# Patient Record
Sex: Female | Born: 2000 | Race: White | Hispanic: No | Marital: Single | Smoking: Never smoker
Health system: Southern US, Community
[De-identification: ages and names within clinical notes are randomized; demographics above are authoritative.]

## PROBLEM LIST (undated history)

## (undated) DIAGNOSIS — F419 Anxiety disorder, unspecified: Secondary | ICD-10-CM

## (undated) HISTORY — DX: Anxiety disorder, unspecified: F41.9

## (undated) HISTORY — PX: NO PAST SURGERIES: SHX2092

---
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
## 2020-02-21 ENCOUNTER — Ambulatory Visit: Payer: Medicaid Other | Attending: Nurse Practitioner | Admitting: Nurse Practitioner

## 2020-02-21 ENCOUNTER — Encounter: Payer: Self-pay | Admitting: Nurse Practitioner

## 2020-02-21 ENCOUNTER — Other Ambulatory Visit: Payer: Self-pay

## 2020-02-21 VITALS — Ht 65.0 in | Wt 168.0 lb

## 2020-02-21 DIAGNOSIS — Z7689 Persons encountering health services in other specified circumstances: Secondary | ICD-10-CM | POA: Diagnosis not present

## 2020-02-21 DIAGNOSIS — Z30011 Encounter for initial prescription of contraceptive pills: Secondary | ICD-10-CM | POA: Diagnosis not present

## 2020-02-21 MED ORDER — ORTHO-NOVUM 7/7/7 (28) 0.5/0.75/1-35 MG-MCG PO TABS: 1.0000 | ORAL_TABLET | Freq: Every day | ORAL | 6 refills | Status: DC

## 2020-02-21 NOTE — Progress Notes (Signed)
Virtual Visit via Telephone Note Due to national recommendations of social distancing due to COVID 19, telehealth visit is felt to be most appropriate for this patient at this time.  I discussed the limitations, risks, security and privacy concerns of performing an evaluation and management service by telephone and the availability of in person appointments. I also discussed with the patient that there may be a patient responsible charge related to this service. The patient expressed understanding and agreed to proceed.    I connected with Becky Day on 02/21/20  at   1:50 PM EST  EDT by telephone and verified that I am speaking with the correct person using two identifiers.   Consent I discussed the limitations, risks, security and privacy concerns of performing an evaluation and management service by telephone and the availability of in person appointments. I also discussed with the patient that there may be a patient responsible charge related to this service. The patient expressed understanding and agreed to proceed.   Location of Patient: Private Residence   [facility] of Provider: [facility]    Persons participating in Telemedicine visit: Becky Denver FNP-BC [name] CMA Becky Day    History of Present Illness: Telemedicine visit for: Establish Care  She has a past medical history of Anxiety.  She is sexually active and is requesting to start OCPs. Her last menstrual cycles was 02-10-2020. She has been sexually active. She will need to come in for a pregnancy test. She is aware that she should not start her OCPs prior to her pregnancy test results. She has taken OCPs in the past as well and denies any side effects.   She denies any previous history of HTN, DM, thyroid disorder or anemia.      Past Medical History:  Diagnosis Date  . Anxiety     Past Surgical History:  Procedure Laterality Date  . NO PAST SURGERIES      Family History   Problem Relation Age of Onset  . Bipolar disorder Mother   . Bipolar disorder Sister   . Bipolar disorder Sister   . Bipolar disorder Sister     Social History   Socioeconomic History  . Marital status: Single    Spouse name: Not on file  . Number of children: Not on file  . Years of education: Not on file  . Highest education level: Not on file  Occupational History  . Not on file  Tobacco Use  . Smoking status: Never Smoker  . Smokeless tobacco: Never Used  Substance and Sexual Activity  . Alcohol use: Not Currently  . Drug use: Not Currently  . Sexual activity: Yes  Other Topics Concern  . Not on file  Social History Narrative  . Not on file   Social Determinants of Health   Financial Resource Strain: Not on file  Food Insecurity: Not on file  Transportation Needs: Not on file  Physical Activity: Not on file  Stress: Not on file  Social Connections: Not on file     Observations/Objective: Awake, alert and oriented x 3   ROS  Assessment and Plan: Becky Day was seen today for new patient (initial visit).  Diagnoses and all orders for this visit:  Encounter to establish care  Oral contraception initial prescription -     norethindrone-ethinyl estradiol (ORTHO-NOVUM 7/7/7, 28,) 0.5/0.75/1-35 MG-MCG tablet; Take 1 tablet by mouth daily. -     POCT urine pregnancy; Future    Follow Up  Instructions Return in about 8 weeks (around 04/17/2020).     I discussed the assessment and treatment plan with the patient. The patient was provided an opportunity to ask questions and all were answered. The patient agreed with the plan and demonstrated an understanding of the instructions.   The patient was advised to call back or seek an in-person evaluation if the symptoms worsen or if the condition fails to improve as anticipated.  I provided 10 minutes of non-face-to-face time during this encounter including median intraservice time, reviewing previous notes, labs, imaging,  medications and explaining diagnosis and management.  Claiborne Rigg, FNP-BC

## 2020-02-26 ENCOUNTER — Other Ambulatory Visit: Payer: Self-pay

## 2020-02-26 ENCOUNTER — Other Ambulatory Visit: Payer: Self-pay | Admitting: Nurse Practitioner

## 2020-02-26 ENCOUNTER — Ambulatory Visit: Payer: Medicaid Other | Attending: Nurse Practitioner

## 2020-02-26 DIAGNOSIS — Z130 Encounter for screening for diseases of the blood and blood-forming organs and certain disorders involving the immune mechanism: Secondary | ICD-10-CM

## 2020-02-27 LAB — CBC
Hematocrit: 41.2 % (ref 34.0–46.6)
Hemoglobin: 13.4 g/dL (ref 11.1–15.9)
MCH: 28.3 pg (ref 26.6–33.0)
MCHC: 32.5 g/dL (ref 31.5–35.7)
MCV: 87 fL (ref 79–97)
Platelets: 320 10*3/uL (ref 150–450)
RBC: 4.73 x10E6/uL (ref 3.77–5.28)
RDW: 12.1 % (ref 11.7–15.4)
WBC: 6.4 10*3/uL (ref 3.4–10.8)

## 2020-04-03 ENCOUNTER — Encounter: Payer: Medicaid Other | Admitting: Nurse Practitioner

## 2020-07-28 ENCOUNTER — Other Ambulatory Visit: Payer: Self-pay | Admitting: Nurse Practitioner

## 2020-07-28 DIAGNOSIS — Z30011 Encounter for initial prescription of contraceptive pills: Secondary | ICD-10-CM

## 2020-07-28 NOTE — Telephone Encounter (Signed)
   Notes to clinic:  Patient was a no show to appt on 04/03/2020 Review for refill    Requested Prescriptions  Pending Prescriptions Disp Refills   norethindrone-ethinyl estradiol (ORTHO-NOVUM 7/7/7, 28,) 0.5/0.75/1-35 MG-MCG tablet 28 tablet 6    Sig: Take 1 tablet by mouth daily.      OB/GYN:  Contraceptives Failed - 07/28/2020 11:13 AM      Failed - Last BP in normal range    BP Readings from Last 1 Encounters:  No data found for BP          Passed - Valid encounter within last 12 months    Recent Outpatient Visits           5 months ago Encounter to establish care   [facility] [place], Shea Stakes, NP

## 2020-07-28 NOTE — Telephone Encounter (Signed)
Copied from CRM [id number]. Topic: Quick Communication - Rx Refill/Question >> Jul 28, 2020 11:07 AM Marylen Ponto wrote: Medication: norethindrone-ethinyl estradiol (ORTHO-NOVUM 7/7/7, 28,) 0.5/0.75/1-35 MG-MCG tablet  Has the patient contacted their pharmacy? No. Pt mother stated pt was told to contact provider (Agent: If no, request that the patient contact the pharmacy for the refill.) (Agent: If yes, when and what did the pharmacy advise?)  Preferred Pharmacy (with phone number or street name): [id number] [place] Sp[place], [place] - [address] AT [facility] [address]  Phone: [phone]  Fax: [phone]  Agent: Please be advised that RX refills may take up to 3 business days. We ask that you follow-up with your pharmacy.

## 2020-08-24 ENCOUNTER — Other Ambulatory Visit: Payer: Self-pay | Admitting: Nurse Practitioner

## 2020-08-24 DIAGNOSIS — Z30011 Encounter for initial prescription of contraceptive pills: Secondary | ICD-10-CM

## 2020-08-24 NOTE — Telephone Encounter (Signed)
Requested medications are due for refill today no, filled 08/24/20  Requested medications are on the active medication list yes  Last refill 08/24/20  Last visit 02/2020, asked to return in April, made appt and No Show  Future visit scheduled no  Notes to clinic Did not show for follow up appt, just filled today, no upcoming appt scheduled.

## 2021-01-15 ENCOUNTER — Encounter: Payer: Self-pay | Admitting: Nurse Practitioner

## 2021-01-15 ENCOUNTER — Ambulatory Visit: Payer: Medicaid Other | Attending: Nurse Practitioner | Admitting: Nurse Practitioner

## 2021-01-15 ENCOUNTER — Other Ambulatory Visit: Payer: Self-pay

## 2021-01-15 DIAGNOSIS — Z30011 Encounter for initial prescription of contraceptive pills: Secondary | ICD-10-CM

## 2021-01-15 DIAGNOSIS — Z7251 High risk heterosexual behavior: Secondary | ICD-10-CM

## 2021-01-15 MED ORDER — NORTREL 7/7/7 0.5/0.75/1-35 MG-MCG PO TABS: 1.0000 | ORAL_TABLET | Freq: Every day | ORAL | 11 refills | Status: DC

## 2021-01-15 NOTE — Progress Notes (Signed)
Virtual Visit via Telephone Note Due to national recommendations of social distancing due to COVID 19, telehealth visit is felt to be most appropriate for this patient at this time.  I discussed the limitations, risks, security and privacy concerns of performing an evaluation and management service by telephone and the availability of in person appointments. I also discussed with the patient that there may be a patient responsible charge related to this service. The patient expressed understanding and agreed to proceed.    I connected with Becky Day on 01/15/21  at  10:10 AM EST  EDT by telephone and verified that I am speaking with the correct person using two identifiers.  Location of Patient: Private Residence   [facility] of Provider: [facility]    Persons participating in Telemedicine visit: Becky Day    History of Present Illness: Telemedicine visit for: Medication refill and STD testing  She is requesting a refill of her oral contraceptives and would like STD testing. She currently denies any GU or GYN symptoms. Wants to know if she can skip placebo pills of her birth control to not have her period. I instructed her if she wants to not have her periods we would need to put her on a birth control that suppress her menstrual cycle and her current prescription would not allow this long term.  She is taking her OCPs as prescribed.      Past Medical History:  Diagnosis Date   Anxiety     Past Surgical History:  Procedure Laterality Date   NO PAST SURGERIES      Family History  Problem Relation Age of Onset   Bipolar disorder Mother    Bipolar disorder Sister    Bipolar disorder Sister    Bipolar disorder Sister     Social History   Socioeconomic History   Marital status: Single    Spouse name: Not on file   Number of children: Not on file   Years of education: Not on file   Highest education level: Not on file   Occupational History   Not on file  Tobacco Use   Smoking status: Never   Smokeless tobacco: Never  Substance and Sexual Activity   Alcohol use: Not Currently   Drug use: Not Currently   Sexual activity: Yes  Other Topics Concern   Not on file  Social History Narrative   Not on file   Social Determinants of Health   Financial Resource Strain: Not on file  Food Insecurity: Not on file  Transportation Needs: Not on file  Physical Activity: Not on file  Stress: Not on file  Social Connections: Not on file     Observations/Objective: Awake, alert and oriented x 3   Review of Systems  Constitutional:  Negative for fever, malaise/fatigue and weight loss.  HENT: Negative.  Negative for nosebleeds.   Eyes: Negative.  Negative for blurred vision, double vision and photophobia.  Respiratory: Negative.  Negative for cough and shortness of breath.   Cardiovascular: Negative.  Negative for chest pain, palpitations and leg swelling.  Gastrointestinal: Negative.  Negative for heartburn, nausea and vomiting.  Musculoskeletal: Negative.  Negative for myalgias.  Neurological: Negative.  Negative for dizziness, focal weakness, seizures and headaches.  Psychiatric/Behavioral: Negative.  Negative for suicidal ideas.    Assessment and Plan: Diagnoses and all orders for this visit:  Oral contraception initial prescription -     norethindrone-ethinyl estradiol (NORTREL 7/7/7) 0.5/0.75/1-35 MG-MCG  tablet; Take 1 tablet by mouth daily.  High risk heterosexual behavior -     Cervicovaginal ancillary only; Future -     HIV antibody (with reflex); Future -     HCV Ab w Reflex to Quant PCR; Future     Follow Up Instructions Return if symptoms worsen or fail to improve.     I discussed the assessment and treatment plan with the patient. The patient was provided an opportunity to ask questions and all were answered. The patient agreed with the plan and demonstrated an understanding of the  instructions.   The patient was advised to call back or seek an in-person evaluation if the symptoms worsen or if the condition fails to improve as anticipated.  I provided 11 minutes of non-face-to-face time during this encounter including median intraservice time, reviewing previous notes, labs, imaging, medications and explaining diagnosis and management.  Claiborne Rigg, FNP-BC

## 2021-02-18 ENCOUNTER — Ambulatory Visit: Payer: Self-pay

## 2021-02-18 ENCOUNTER — Other Ambulatory Visit: Payer: Self-pay | Admitting: Nurse Practitioner

## 2021-02-18 DIAGNOSIS — Z30011 Encounter for initial prescription of contraceptive pills: Secondary | ICD-10-CM

## 2021-02-18 NOTE — Telephone Encounter (Signed)
Pt called to get STD test appt / she think she has a yeast infection but wants to be sure / please advise  Voice mailbox not set up, unable to leave a message.

## 2021-02-18 NOTE — Telephone Encounter (Signed)
Pt called, pt states she wanted to schedule an appt for STD testing. Advised her appts were out until 03/17/21 and if she needed testing sooner she could call [facility] and see if they had any appts. Pt was ok scheduling for 03/17/21. Pt didn't report any yeast infections symptoms or any other concerns.

## 2021-02-18 NOTE — Telephone Encounter (Signed)
Refilled 01/15/2021 #28 with 11 refills.   Requested Prescriptions  Pending Prescriptions Disp Refills   NORTREL 7/7/7 0.5/0.75/1-35 MG-MCG tablet [Pharmacy Med Name: NORTREL 7/ 7/ 7 TABLETS 28S] 28 tablet 11    Sig: TAKE 1 TABLET BY MOUTH DAILY     OB/GYN:  Contraceptives Failed - 02/18/2021  3:17 AM      Failed - Last BP in normal range    BP Readings from Last 1 Encounters:  No data found for BP         Failed - Valid encounter within last 12 months    Recent Outpatient Visits          1 month ago Oral contraception initial prescription   [facility] [place], Shea Stakes, NP   12 months ago Encounter to establish care   [facility] [place], Shea Stakes, NP             Passed - Patient is not a smoker

## 2021-03-17 ENCOUNTER — Ambulatory Visit: Payer: Medicaid Other | Attending: Physician Assistant

## 2021-03-17 ENCOUNTER — Other Ambulatory Visit: Payer: Self-pay

## 2021-03-17 ENCOUNTER — Encounter: Payer: Self-pay | Admitting: Nurse Practitioner

## 2021-03-17 ENCOUNTER — Other Ambulatory Visit (HOSPITAL_COMMUNITY)
Admission: RE | Admit: 2021-03-17 | Discharge: 2021-03-17 | Disposition: A | Discharge status: Home / Self Care | Payer: Medicaid Other | Source: Ambulatory Visit | Attending: Physician Assistant | Admitting: Physician Assistant

## 2021-03-17 DIAGNOSIS — Z7251 High risk heterosexual behavior: Secondary | ICD-10-CM

## 2021-03-18 LAB — CERVICOVAGINAL ANCILLARY ONLY
Bacterial Vaginitis (gardnerella): NEGATIVE
Candida Glabrata: NEGATIVE
Candida Vaginitis: NEGATIVE
Chlamydia: NEGATIVE
Comment: NEGATIVE
Comment: NEGATIVE
Comment: NEGATIVE
Comment: NEGATIVE
Comment: NEGATIVE
Comment: NORMAL
Neisseria Gonorrhea: NEGATIVE
Trichomonas: NEGATIVE

## 2021-03-18 LAB — HCV AB W REFLEX TO QUANT PCR: HCV Ab: NONREACTIVE

## 2021-03-18 LAB — HIV ANTIBODY (ROUTINE TESTING W REFLEX): HIV Screen 4th Generation wRfx: NONREACTIVE

## 2021-03-18 LAB — HCV INTERPRETATION

## 2021-03-23 ENCOUNTER — Other Ambulatory Visit: Payer: Self-pay

## 2021-03-23 ENCOUNTER — Ambulatory Visit: Payer: Medicaid Other | Attending: Nurse Practitioner | Admitting: Nurse Practitioner

## 2021-03-23 ENCOUNTER — Encounter: Payer: Self-pay | Admitting: Nurse Practitioner

## 2021-03-23 DIAGNOSIS — F419 Anxiety disorder, unspecified: Secondary | ICD-10-CM

## 2021-03-23 DIAGNOSIS — F32A Depression, unspecified: Secondary | ICD-10-CM

## 2021-03-23 MED ORDER — ESCITALOPRAM OXALATE 10 MG PO TABS: 10.0000 mg | ORAL_TABLET | Freq: Every day | ORAL | 1 refills | Status: DC

## 2021-03-23 NOTE — Progress Notes (Signed)
Virtual Visit via Telephone Note ?Due to national recommendations of social distancing due to COVID 19, telehealth visit is felt to be most appropriate for this patient at this time.  I discussed the limitations, risks, security and privacy concerns of performing an evaluation and management service by telephone and the availability of in person appointments. I also discussed with the patient that there may be a patient responsible charge related to this service. The patient expressed understanding and agreed to proceed.  ? ? ?I connected with Catha Gosselin on 03/23/21  at   3:10 PM EDT  EDT by telephone and verified that I am speaking with the correct person using two identifiers. ? ?Location of Patient: ?Private Residence ?  ?Location of Provider: ?[facility] [employer] [facility]  ?  ?Persons participating in Telemedicine visit: ?[name] FNP-BC ?Catha Gosselin  ?  ?History of Present Illness: ?Telemedicine visit for: Anxiety and Depression ?States she has been depresses and anxious "my whole life".  ? ?She endorses associated symptoms of panic attacks, anxiety attacks with difficulty breathing. She also has social anxiety. Will start lexapro 10 mg daily. She does endorse ruminating thoughts of self harm with no specific plan. Feels like she sometimes does not "want to be here".  ?Depression screen [facility] 2/9 03/23/2021 02/21/2020  ?Decreased Interest 1 2  ?Down, Depressed, Hopeless 1 0  ?PHQ - 2 Score 2 2  ?Altered sleeping 3 1  ?Tired, decreased energy 3 2  ?Change in appetite 2 1  ?Feeling bad or failure about yourself  1 3  ?Trouble concentrating 0 0  ?Moving slowly or fidgety/restless 0 0  ?Suicidal thoughts 1 0  ?PHQ-9 Score 12 9  ?Difficult doing work/chores Very difficult -  ?  ?GAD 7 : Generalized Anxiety Score 03/23/2021 02/21/2020  ?Nervous, Anxious, on Edge 2 1  ?Control/stop worrying 2 0  ?Worry too much - different things 2 0  ?Trouble relaxing 1 0  ?Restless 0 0  ?Easily annoyed or irritable 1  0  ?Afraid - awful might happen 2 0  ?Total GAD 7 Score 10 1  ?Anxiety Difficulty Somewhat difficult -  ? ?  ? ? ? ?Past Medical History:  ?Diagnosis Date  ? Anxiety   ?  ?Past Surgical History:  ?Procedure Laterality Date  ? NO PAST SURGERIES    ?  ?Family History  ?Problem Relation Age of Onset  ? Bipolar disorder Mother   ? Bipolar disorder Sister   ? Bipolar disorder Sister   ? Bipolar disorder Sister   ?  ?Social History  ? ?Socioeconomic History  ? Marital status: Single  ?  Spouse name: Not on file  ? Number of children: Not on file  ? Years of education: Not on file  ? Highest education level: Not on file  ?Occupational History  ? Not on file  ?Tobacco Use  ? Smoking status: Never  ? Smokeless tobacco: Never  ?Substance and Sexual Activity  ? Alcohol use: Not Currently  ? Drug use: Not Currently  ? Sexual activity: Yes  ?Other Topics Concern  ? Not on file  ?Social History Narrative  ? Not on file  ? ?Social Determinants of Health  ? ?Financial Resource Strain: Not on file  ?Food Insecurity: Not on file  ?Transportation Needs: Not on file  ?Physical Activity: Not on file  ?Stress: Not on file  ?Social Connections: Not on file  ?  ? ?Observations/Objective: ?Awake, alert and oriented x 3 ? ? ?  Review of Systems  ?Constitutional:  Negative for fever, malaise/fatigue and weight loss.  ?HENT: Negative.  Negative for nosebleeds.   ?Eyes: Negative.  Negative for blurred vision, double vision and photophobia.  ?Respiratory: Negative.  Negative for cough and shortness of breath.   ?Cardiovascular: Negative.  Negative for chest pain, palpitations and leg swelling.  ?Gastrointestinal: Negative.  Negative for heartburn, nausea and vomiting.  ?Musculoskeletal: Negative.  Negative for myalgias.  ?Neurological: Negative.  Negative for dizziness, focal weakness, seizures and headaches.  ?Psychiatric/Behavioral:  Positive for depression. Negative for suicidal ideas. The patient is nervous/anxious.    ?Assessment and  Plan: ?Diagnoses and all orders for this visit: ? ?Anxiety and depression ?-     escitalopram (LEXAPRO) 10 MG tablet; Take 1 tablet (10 mg total) by mouth daily. Take 1/2 tablet 5mg  for 2 weeks. Increase to 1 whole tablet 10mg  after 2 weeks if needed. ?-     Ambulatory referral to Integrated Behavioral Health ? ?  ? ?Follow Up Instructions ?Return in about 4 weeks (around 04/20/2021).  ? ?  ?I discussed the assessment and treatment plan with the patient. The patient was provided an opportunity to ask questions and all were answered. The patient agreed with the plan and demonstrated an understanding of the instructions. ?  ?The patient was advised to call back or seek an in-person evaluation if the symptoms worsen or if the condition fails to improve as anticipated. ? ?I provided 15 minutes of non-face-to-face time during this encounter including median intraservice time, reviewing previous notes, labs, imaging, medications and explaining diagnosis and management. ? ? , FNP-BC  ?

## 2021-04-07 ENCOUNTER — Ambulatory Visit: Payer: Self-pay | Admitting: *Deleted

## 2021-04-07 ENCOUNTER — Telehealth: Payer: Medicaid Other | Admitting: Physician Assistant

## 2021-04-07 DIAGNOSIS — J029 Acute pharyngitis, unspecified: Secondary | ICD-10-CM

## 2021-04-07 MED ORDER — AMOXICILLIN 500 MG PO CAPS: 500.0000 mg | ORAL_CAPSULE | Freq: Two times a day (BID) | ORAL | 0 refills | Status: AC

## 2021-04-07 NOTE — Patient Instructions (Signed)
?Catha Gosselin, thank you for joining Piedad Climes, PA-C for today's virtual visit.  While this provider is not your primary care provider (PCP), if your PCP is located in our provider database this encounter information will be shared with them immediately following your visit. ? ?Consent: ?(Patient) Becky Day provided verbal consent for this virtual visit at the beginning of the encounter. ? ?Current Medications: ? ?Current Outpatient Medications:  ?  amoxicillin (AMOXIL) 500 MG capsule, Take 1 capsule (500 mg total) by mouth 2 (two) times daily for 10 days., Disp: 20 capsule, Rfl: 0 ?  escitalopram (LEXAPRO) 10 MG tablet, Take 1 tablet (10 mg total) by mouth daily. Take 1/2 tablet 5mg  for 2 weeks. Increase to 1 whole tablet 10mg  after 2 weeks if needed., Disp: 30 tablet, Rfl: 1 ?  norethindrone-ethinyl estradiol (NORTREL 7/7/7) 0.5/0.75/1-35 MG-MCG tablet, Take 1 tablet by mouth daily., Disp: 28 tablet, Rfl: 11  ? ?Medications ordered in this encounter:  ?Meds ordered this encounter  ?Medications  ? amoxicillin (AMOXIL) 500 MG capsule  ?  Sig: Take 1 capsule (500 mg total) by mouth 2 (two) times daily for 10 days.  ?  Dispense:  20 capsule  ?  Refill:  0  ?  Order Specific Question:   Supervising Provider  ?  Answer:   11-22-2003 [[id number]]  ?  ? ?*If you need refills on other medications prior to your next appointment, please contact your pharmacy* ? ?Follow-Up: ?Call back or seek an in-person evaluation if the symptoms worsen or if the condition fails to improve as anticipated. ? ?Other Instructions ?Please keep well-hydrated and get plenty of rest. ?Start salt-water gargles. ?You can alternate Tylenol and Ibuprofen as needed for pain. ?Take the antibiotic as directed with food to help prevent any stomach upset. ? ?If symptoms are not resolving, or you note any new/worsening symptoms despite treatment, please be evaluated in person.  ? ?Strep Throat, Adult ?Strep throat is an infection of the throat. It  is caused by germs (bacteria). Strep throat is common during the cold months of the year. It mostly affects children who are 25-63 years old. However, people of all ages can get it at any time of the year. This infection spreads from person to person through coughing, sneezing, or having close contact. ?What are the causes? ?This condition is caused by the Streptococcus pyogenes germ. ?What increases the risk? ?You care for young children. Children are more likely to get strep throat and may spread it to others. ?You go to crowded places. Germs can spread easily in such places. ?You kiss or touch someone who has strep throat. ?What are the signs or symptoms? ?Fever or chills. ?Redness, swelling, or pain in the tonsils or throat. ?Pain or trouble when swallowing. ?White or yellow spots on the tonsils or throat. ?Tender glands in the neck and under the jaw. ?Bad breath. ?Red rash all over the body. This is rare. ?How is this treated? ?Medicines that kill germs (antibiotics). ?Medicines that treat pain or fever. These include: ?Ibuprofen or acetaminophen. ?Aspirin, only for people who are over the age of 56. ?Cough drops. ?Throat sprays. ?Follow these instructions at home: ?Medicines ? ?Take over-the-counter and prescription medicines only as told by your doctor. ?Take your antibiotic medicine as told by your doctor. Do not stop taking the antibiotic even if you start to feel better. ?Eating and drinking ? ?If you have trouble swallowing, eat soft foods until your throat feels better. ?Drink  enough fluid to keep your pee (urine) pale yellow. ?To help with pain, you may have: ?Warm fluids, such as soup and tea. ?Cold fluids, such as frozen desserts or popsicles. ?General instructions ?Rinse your mouth (gargle) with a salt-water mixture 3-4 times a day or as needed. To make a salt-water mixture, dissolve ?-1 tsp (3-6 g) of salt in 1 cup (237 mL) of warm water. ?Rest as much as you can. ?Stay home from work or school until  you have been taking antibiotics for 24 hours. ?Do not smoke or use any products that contain nicotine or tobacco. If you need help quitting, ask your doctor. ?Keep all follow-up visits. ?How is this prevented? ? ?Do not share food, drinking cups, or personal items. They can cause the germs to spread. ?Wash your hands well with soap and water. Make sure that all people in your house wash their hands well. ?Have family members tested if they have a fever or a sore throat. They may need an antibiotic if they have strep throat. ?Contact a doctor if: ?You have swelling in your neck that keeps getting bigger. ?You get a rash, cough, or earache. ?You cough up a thick fluid that is green, yellow-brown, or bloody. ?You have pain that does not get better with medicine. ?Your symptoms get worse instead of getting better. ?You have a fever. ?Get help right away if: ?You vomit. ?You have a very bad headache. ?Your neck hurts or feels stiff. ?You have chest pain or are short of breath. ?You have drooling, very bad throat pain, or changes in your voice. ?Your neck is swollen, or the skin gets red and tender. ?Your mouth is dry, or you are peeing less than normal. ?You keep feeling more tired or have trouble waking up. ?Your joints are red or painful. ?These symptoms may be an emergency. Do not wait to see if the symptoms will go away. Get help right away. Call your local emergency services (911 in the [place] ?Summary ?Strep throat is an infection of the throat. It is caused by germs (bacteria). ?This infection can spread from person to person through coughing, sneezing, or having close contact. ?Take your medicines, including antibiotics, as told by your doctor. Do not stop taking the antibiotic even if you start to feel better. ?To prevent the spread of germs, wash your hands well with soap and water. Have others do the same. Do not share food, drinking cups, or personal items. ?Get help right away if you have a bad headache,  chest pain, shortness of breath, a stiff or painful neck, or you vomit. ?This information is not intended to replace advice given to you by your health care provider. Make sure you discuss any questions you have with your health care provider. ?Document Revised: 04/14/2020 Document Reviewed: 04/14/2020 ?Elsevier Patient Education ? 2022 [employer]vier Inc[employer] ? ? ? ?If you have been instructed to have an in-person evaluation today at a local Urgent Care facility, please use the link below. It will take you to a list of all of our available [facility], including address, phone number and hours of operation. Please do not delay care.  ?[facility] ? ?If you or a family member do not have a primary care provider, use the link below to schedule a visit and establish care. When you choose a [facility] primary care physician or advanced practice provider, you gain a long-term partner in health. ?Find a Primary Care Provider ? ?Learn more  about 's in-office and virtual care options: ?[facility]  ?

## 2021-04-07 NOTE — Telephone Encounter (Signed)
?  Chief Complaint: sore throat ?Symptoms: sore throat, ear pain,fever ?Frequency: 2 days ?Pertinent Negatives: Patient denies  difficulty breathing, headache, rash ?Disposition: [] ED /[] Urgent Care (no appt availability in office) / [] Appointment(In office/virtual)/ [x]  [facility]/ [] Home Care/ [] Refused Recommended Disposition /[] [facility]/ []  Follow-up with PCP ?Additional Notes:    ?

## 2021-04-07 NOTE — Progress Notes (Signed)
?Virtual Visit Consent  ? ?Smitty Pluck, you are scheduled for a virtual visit with a [facility] provider today.   ?  ?Just as with appointments in the office, your consent must be obtained to participate.  Your consent will be active for this visit and any virtual visit you may have with one of our providers in the next 365 days.   ?  ?If you have a MyChart account, a copy of this consent can be sent to you electronically.  All virtual visits are billed to your insurance company just like a traditional visit in the office.   ? ?As this is a virtual visit, video technology does not allow for your provider to perform a traditional examination.  This may limit your provider's ability to fully assess your condition.  If your provider identifies any concerns that need to be evaluated in person or the need to arrange testing (such as labs, EKG, etc.), we will make arrangements to do so.   ?  ?Although advances in technology are sophisticated, we cannot ensure that it will always work on either your end or our end.  If the connection with a video visit is poor, the visit may have to be switched to a telephone visit.  With either a video or telephone visit, we are not always able to ensure that we have a secure connection.    ? ?I need to obtain your verbal consent now.   Are you willing to proceed with your visit today?  ?  ?Becky Day has provided verbal consent on 04/07/2021 for a virtual visit (video or telephone). ?  ?Becky Rio, PA-C  ? ?Date: 04/07/2021 12:47 PM ? ? ?Virtual Visit via Video Note  ? ?ILeeanne Day, connected with  Becky Day  ([id number], May 21, 2000) on 04/07/21 at 12:45 PM EDT by a video-enabled telemedicine application and verified that I am speaking with the correct person using two identifiers. ? ?Location: ?Patient: Virtual Visit Location Patient: Home ?Provider: Virtual Visit Location Provider: Home Office ?  ?I discussed the limitations of evaluation and management by  telemedicine and the availability of in person appointments. The patient expressed understanding and agreed to proceed.   ? ?History of Present Illness: ?Becky Day is a 21 y.o. who identifies as a female who was assigned female at birth, and is being seen today for significant sore throat over the past two days with swollen anterior lymph nodes. As of last night noting white patches on her tonsils. Now also with bilateral earl pain. Has felt feverish today. Denies chest congestion or cough. Denies recent travel or known sick contact.  ? ? ?HPI: HPI  ?Problems: There are no problems to display for this patient. ?  ?Allergies: No Known Allergies ?Medications:  ?Current Outpatient Medications:  ?  amoxicillin (AMOXIL) 500 MG capsule, Take 1 capsule (500 mg total) by mouth 2 (two) times daily for 10 days., Disp: 20 capsule, Rfl: 0 ?  escitalopram (LEXAPRO) 10 MG tablet, Take 1 tablet (10 mg total) by mouth daily. Take 1/2 tablet 5mg  for 2 weeks. Increase to 1 whole tablet 10mg  after 2 weeks if needed., Disp: 30 tablet, Rfl: 1 ?  norethindrone-ethinyl estradiol (NORTREL 7/7/7) 0.5/0.75/1-35 MG-MCG tablet, Take 1 tablet by mouth daily., Disp: 28 tablet, Rfl: 11 ? ?Observations/Objective: ?Patient is well-developed, well-nourished in no acute distress.  ?Resting comfortably at home.  ?Head is normocephalic, atraumatic.  ?No labored breathing. ?Speech is clear and coherent with  logical content.  ?Patient is alert and oriented at baseline.  ?Bilateral tonsillar exudate noted with erythema. Uvula midline. Tonsils 2-3+ bilaterally.  ? ?Assessment and Plan: ?1. Exudative pharyngitis ?- amoxicillin (AMOXIL) 500 MG capsule; Take 1 capsule (500 mg total) by mouth 2 (two) times daily for 10 days.  Dispense: 20 capsule; Refill: 0 ? ?Concern for strep. Stat Amox 500 mg BID x 10 days. Supportive measures and OTC medications reviewed. Follow-up discussed.  ? ?Follow Up Instructions: ?I discussed the assessment and treatment plan with  the patient. The patient was provided an opportunity to ask questions and all were answered. The patient agreed with the plan and demonstrated an understanding of the instructions.  A copy of instructions were sent to the patient via MyChart unless otherwise noted below.  ? ?The patient was advised to call back or seek an in-person evaluation if the symptoms worsen or if the condition fails to improve as anticipated. ? ?Time:  ?I spent 10 minutes with the patient via telehealth technology discussing the above problems/concerns.   ? ?Becky Rio, PA-C ?

## 2021-04-07 NOTE — Telephone Encounter (Signed)
Summary: possible strep throat  ? Pt called in stating she thinks she may have strep throat and wanted to speak with someone about what she can do, there were also no appts available, please advise.   ?  ? ?Reason for Disposition ? [1] Pus on tonsils (back of throat) AND [2]  fever AND [3] swollen neck lymph nodes ("glands") ? ?Answer Assessment - Initial Assessment Questions ?1. ONSET: "When did the throat start hurting?" (Hours or days ago)  ?    2 days ago ?2. SEVERITY: "How bad is the sore throat?" (Scale 1-10; mild, moderate or severe) ?  - MILD (1-3):  doesn't interfere with eating or normal activities ?  - MODERATE (4-7): interferes with eating some solids and normal activities ?  - SEVERE (8-10):  excruciating pain, interferes with most normal activities ?  - SEVERE DYSPHAGIA: can't swallow liquids, drooling ?    moderate ?3. STREP EXPOSURE: "Has there been any exposure to strep within the past week?" If Yes, ask: "What type of contact occurred?"  ?    unknown ?4.  VIRAL SYMPTOMS: "Are there any symptoms of a cold, such as a runny nose, cough, hoarse voice or red eyes?"  ?    Some nasal drainage, ear pain- both sides ?5. FEVER: "Do you have a fever?" If Yes, ask: "What is your temperature, how was it measured, and when did it start?" ?    Yes- has not measure- but feels warm ?6. PUS ON THE TONSILS: "Is there pus on the tonsils in the back of your throat?" ?    Inflammation in throat with white pockets ?7. OTHER SYMPTOMS: "Do you have any other symptoms?" (e.g., difficulty breathing, headache, rash) ?    Ear pain, back pain ?8. PREGNANCY: "Is there any chance you are pregnant?" "When was your last menstrual period?" ?    No- LMP- 2 weeks ago ? ?Protocols used: Sore Throat-A-AH ? ?

## 2021-04-10 ENCOUNTER — Telehealth: Payer: Medicaid Other | Admitting: Nurse Practitioner

## 2021-04-10 DIAGNOSIS — B001 Herpesviral vesicular dermatitis: Secondary | ICD-10-CM | POA: Diagnosis not present

## 2021-04-10 DIAGNOSIS — J029 Acute pharyngitis, unspecified: Secondary | ICD-10-CM

## 2021-04-10 MED ORDER — AZITHROMYCIN 250 MG PO TABS: ORAL_TABLET | ORAL | 0 refills | Status: AC

## 2021-04-10 MED ORDER — VALACYCLOVIR HCL 1 G PO TABS: 2000.0000 mg | ORAL_TABLET | Freq: Two times a day (BID) | ORAL | 1 refills | Status: AC

## 2021-04-10 NOTE — Progress Notes (Signed)
?Virtual Visit Consent  ? ?Becky Day, you are scheduled for a virtual visit with a [facility] provider today.   ?  ?Just as with appointments in the office, your consent must be obtained to participate.  Your consent will be active for this visit and any virtual visit you may have with one of our providers in the next 365 days.   ?  ?If you have a MyChart account, a copy of this consent can be sent to you electronically.  All virtual visits are billed to your insurance company just like a traditional visit in the office.   ? ?As this is a virtual visit, video technology does not allow for your provider to perform a traditional examination.  This may limit your provider's ability to fully assess your condition.  If your provider identifies any concerns that need to be evaluated in person or the need to arrange testing (such as labs, EKG, etc.), we will make arrangements to do so.   ?  ?Although advances in technology are sophisticated, we cannot ensure that it will always work on either your end or our end.  If the connection with a video visit is poor, the visit may have to be switched to a telephone visit.  With either a video or telephone visit, we are not always able to ensure that we have a secure connection.    ? ?I need to obtain your verbal consent now.   Are you willing to proceed with your visit today?  ?  ?Becky Day has provided verbal consent on 04/10/2021 for a virtual visit (video or telephone). ?  ?Claiborne Rigg, NP  ? ?Date: 04/10/2021 10:26 AM ? ? ?Virtual Visit via Video Note  ? ?[name], connected with  Becky Day  ([id number], August 23, 2000, 21) on 04/10/21 at 10:30 AM EDT by a video-enabled telemedicine application and verified that I am speaking with the correct person using two identifiers. ? ?Location: ?Patient: Virtual Visit Location Patient: Home ?Provider: Virtual Visit Location Provider: Home Office ?  ?I discussed the limitations of evaluation and management by telemedicine and the  availability of in person appointments. The patient expressed understanding and agreed to proceed.   ? ?History of Present Illness: ?Becky Day is a 22 y.o. who identifies as a female who was assigned female at birth, and is being seen today for Reaction to PCN and recurrent herpes labialis. ? ?She was started on amoxicillin for presumed bacterial strep pharyngitis a few days ago. Now with vesicular rash on the left side of her nose. Unclear if related to pcn allergy or HSV. 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  ? ? ?Problems: There are no problems to display for this patient. ?  ?Allergies: No Known Allergies ?Medications:  ?Current Outpatient Medications:  ?  azithromycin (ZITHROMAX) 250 MG tablet, Take 2 tablets on day 1, then 1 tablet daily on days 2 through 5, Disp: 6 tablet, Rfl: 0 ?  valACYclovir (VALTREX) 1000 MG tablet, Take 2 tablets (2,000 mg total) by mouth 2 (two) times daily for 1 day., Disp: 4 tablet, Rfl: 1 ?  amoxicillin (AMOXIL) 500 MG capsule, Take 1 capsule (500 mg total) by mouth 2 (two) times daily for 10 days., Disp: 20 capsule, Rfl: 0 ?  escitalopram (LEXAPRO) 10 MG tablet, Take 1 tablet (10 mg total) by mouth daily. Take 1/2 tablet  5mg  for 2 weeks. Increase to 1 whole tablet 10mg  after 2 weeks if needed., Disp: 30 tablet, Rfl: 1 ?  norethindrone-ethinyl estradiol (NORTREL 7/7/7) 0.5/0.75/1-35 MG-MCG tablet, Take 1 tablet by mouth daily., Disp: 28 tablet, Rfl: 11 ? ?Observations/Objective: ?Patient is well-developed, well-nourished in no acute distress.  ?Resting comfortably  at home.  ?Head is normocephalic, atraumatic.  ?No labored breathing.  ?Speech is clear and coherent with logical content.  ?Patient is alert and oriented at baseline.  ? ? ?Assessment and Plan: ?1. Exudative pharyngitis ?- azithromycin (ZITHROMAX) 250 MG tablet; Take 2 tablets on day 1, then 1 tablet daily on days 2 through 5  Dispense: 6  tablet; Refill: 0 ? ?2. Recurrent herpes labialis ?- valACYclovir (VALTREX) 1000 MG tablet; Take 2 tablets (2,000 mg total) by mouth 2 (two) times daily for 1 day.  Dispense: 4 tablet; Refill: 1 ? ?As this is her first time taking amoxicillin that she is aware of. Will switch to zpak and will also treat today for presumed HSV1 outbreak on nose ? ?Follow Up Instructions: ?I discussed the assessment and treatment plan with the patient. The patient was provided an opportunity to ask questions and all were answered. The patient agreed with the plan and demonstrated an understanding of the instructions.  A copy of instructions were sent to the patient via MyChart unless otherwise noted below.  ? ?The patient was advised to call back or seek an in-person evaluation if the symptoms worsen or if the condition fails to improve as anticipated. ? ?Time:  ?I spent 11 minutes with the patient via telehealth technology discussing the above problems/concerns.   ? ?11-22-2003, NP  ?

## 2021-04-10 NOTE — Patient Instructions (Addendum)
?  Smitty Pluck, thank you for joining Gildardo Pounds, NP for today's virtual visit.  While this provider is not your primary care provider (PCP), if your PCP is located in our provider database this encounter information will be shared with them immediately following your visit. ? ?Consent: ?(Patient) Becky Day provided verbal consent for this virtual visit at the beginning of the encounter. ? ?Current Medications: ? ?Current Outpatient Medications:  ?  azithromycin (ZITHROMAX) 250 MG tablet, Take 2 tablets on day 1, then 1 tablet daily on days 2 through 5, Disp: 6 tablet, Rfl: 0 ?  valACYclovir (VALTREX) 1000 MG tablet, Take 2 tablets (2,000 mg total) by mouth 2 (two) times daily for 1 day., Disp: 4 tablet, Rfl: 1 ?  amoxicillin (AMOXIL) 500 MG capsule, Take 1 capsule (500 mg total) by mouth 2 (two) times daily for 10 days., Disp: 20 capsule, Rfl: 0 ?  escitalopram (LEXAPRO) 10 MG tablet, Take 1 tablet (10 mg total) by mouth daily. Take 1/2 tablet 5mg  for 2 weeks. Increase to 1 whole tablet 10mg  after 2 weeks if needed., Disp: 30 tablet, Rfl: 1 ?  norethindrone-ethinyl estradiol (NORTREL 7/7/7) 0.5/0.75/1-35 MG-MCG tablet, Take 1 tablet by mouth daily., Disp: 28 tablet, Rfl: 11  ? ?Medications ordered in this encounter:  ?Meds ordered this encounter  ?Medications  ? valACYclovir (VALTREX) 1000 MG tablet  ?  Sig: Take 2 tablets (2,000 mg total) by mouth 2 (two) times daily for 1 day.  ?  Dispense:  4 tablet  ?  Refill:  1  ?  Order Specific Question:   Supervising Provider  ?  Answer:   Noemi Chapel [[id number]]  ? azithromycin (ZITHROMAX) 250 MG tablet  ?  Sig: Take 2 tablets on day 1, then 1 tablet daily on days 2 through 5  ?  Dispense:  6 tablet  ?  Refill:  0  ?  Order Specific Question:   Supervising Provider  ?  Answer:   Noemi Chapel [[id number]]  ?  ? ?*If you need refills on other medications prior to your next appointment, please contact your pharmacy* ? ?Follow-Up: ?Call back or seek an in-person evaluation if  the symptoms worsen or if the condition fails to improve as anticipated. ? ?Other Instructions ?Warm salt water gargles for sore throat ? ? ?If you have been instructed to have an in-person evaluation today at a local Urgent Care facility, please use the link below. It will take you to a list of all of our available [facility], including address, phone number and hours of operation. Please do not delay care.  ?[facility] ? ?If you or a family member do not have a primary care provider, use the link below to schedule a visit and establish care. When you choose a [facility] primary care physician or advanced practice provider, you gain a long-term partner in health. ?Find a Primary Care Provider ? ?Learn more about [facility]'s in-office and virtual care options: ?[facility]  ?

## 2021-04-27 ENCOUNTER — Ambulatory Visit: Payer: Medicaid Other | Attending: Nurse Practitioner | Admitting: Nurse Practitioner

## 2021-04-27 ENCOUNTER — Encounter: Payer: Self-pay | Admitting: Nurse Practitioner

## 2021-04-27 DIAGNOSIS — F32A Depression, unspecified: Secondary | ICD-10-CM | POA: Diagnosis not present

## 2021-04-27 DIAGNOSIS — F419 Anxiety disorder, unspecified: Secondary | ICD-10-CM

## 2021-04-27 MED ORDER — ESCITALOPRAM OXALATE 10 MG PO TABS: 10.0000 mg | ORAL_TABLET | Freq: Every day | ORAL | 1 refills | Status: DC

## 2021-04-27 NOTE — Progress Notes (Signed)
Virtual Visit via Telephone Note ? I discussed the limitations, risks, security and privacy concerns of performing an evaluation and management service by telephone and the availability of in person appointments. I also discussed with the patient that there may be a patient responsible charge related to this service. The patient expressed understanding and agreed to proceed.  ? ? ?I connected with Becky Day on 04/27/21  at   1:30 PM EDT  EDT by telephone and verified that I am speaking with the correct person using two identifiers. ? ?Location of Patient: ?Private Residence ?  ?Location of Provider: ?Scient[facility], research (physical sciences) and [employer] Office  ?  ?Persons participating in Telemedicine visit: ?[name] FNP-BC ?Becky Day  ?  ?History of Present Illness: ?Telemedicine visit for: Anxiety and Depression ? ?She was started on lexapro 10 mg a few weeks ago due to anxiety and depression. At that time she was also referred to behavioral health. She has since met her therapist and also has an appt she believes tomorrow. PHQ9 and GAD 7 scores have improved and she reports noticeable improvement in mood lability. ? ?  04/27/2021  ?  1:53 PM 03/23/2021  ? 12:58 PM 02/21/2020  ?  1:59 PM  ?Depression screen PHQ 2/9  ?Decreased Interest 0 1 2  ?Down, Depressed, Hopeless 0 1 0  ?PHQ - 2 Score 0 2 2  ?Altered sleeping 1 3 1   ?Tired, decreased energy 1 3 2   ?Change in appetite 1 2 1   ?Feeling bad or failure about yourself  0 1 3  ?Trouble concentrating 0 0 0  ?Moving slowly or fidgety/restless 0 0 0  ?Suicidal thoughts 0 1 0  ?PHQ-9 Score 3 12 9   ?Difficult doing work/chores Not difficult at all Very difficult   ?  ? ?  04/27/2021  ?  1:56 PM 03/23/2021  ?  1:00 PM 02/21/2020  ?  2:00 PM  ?GAD 7 : Generalized Anxiety Score  ?Nervous, Anxious, on Edge 1 2 1   ?Control/stop worrying 1 2 0  ?Worry too much - different things 1 2 0  ?Trouble relaxing 0 1 0  ?Restless 0 0 0  ?Easily annoyed or irritable 1 1 0  ?Afraid - awful might  happen 1 2 0  ?Total GAD 7 Score 5 10 1   ?Anxiety Difficulty Not difficult at all Somewhat difficult   ? ?  ? ? ? ?Past Medical History:  ?Diagnosis Date  ? Anxiety   ?  ?Past Surgical History:  ?Procedure Laterality Date  ? NO PAST SURGERIES    ?  ?Family History  ?Problem Relation Age of Onset  ? Bipolar disorder Mother   ? Bipolar disorder Sister   ? Bipolar disorder Sister   ? Bipolar disorder Sister   ?  ?Social History  ? ?Socioeconomic History  ? Marital status: Single  ?  Spouse name: Not on file  ? Number of children: Not on file  ? Years of education: Not on file  ? Highest education level: Not on file  ?Occupational History  ? Not on file  ?Tobacco Use  ? Smoking status: Never  ? Smokeless tobacco: Never  ?Substance and Sexual Activity  ? Alcohol use: Not Currently  ? Drug use: Not Currently  ? Sexual activity: Yes  ?Other Topics Concern  ? Not on file  ?Social History Narrative  ? Not on file  ? ?Social Determinants of Health  ? ?Financial Resource Strain: Not on file  ?Food Insecurity:  Not on file  ?Transportation Needs: Not on file  ?Physical Activity: Not on file  ?Stress: Not on file  ?Social Connections: Not on file  ?  ? ?Observations/Objective: ?Awake, alert and oriented x 3 ? ? ?Review of Systems  ?Constitutional:  Negative for fever, malaise/fatigue and weight loss.  ?HENT: Negative.  Negative for nosebleeds.   ?Eyes: Negative.  Negative for blurred vision, double vision and photophobia.  ?Respiratory: Negative.  Negative for cough and shortness of breath.   ?Cardiovascular: Negative.  Negative for chest pain, palpitations and leg swelling.  ?Gastrointestinal: Negative.  Negative for heartburn, nausea and vomiting.  ?Musculoskeletal: Negative.  Negative for myalgias.  ?Neurological: Negative.  Negative for dizziness, focal weakness, seizures and headaches.  ?Psychiatric/Behavioral:  Positive for depression. Negative for suicidal ideas. The patient is nervous/anxious.    ?Assessment and  Plan: ?Diagnoses and all orders for this visit: ? ?Anxiety and depression ?-     escitalopram (LEXAPRO) 10 MG tablet; Take 1 tablet (10 mg total) by mouth daily. ?Continue follow up with therapy as prescribed.  ?  ? ?Follow Up Instructions ?Return if symptoms worsen or fail to improve.  ? ?  ?I discussed the assessment and treatment plan with the patient. The patient was provided an opportunity to ask questions and all were answered. The patient agreed with the plan and demonstrated an understanding of the instructions. ?  ?The patient was advised to call back or seek an in-person evaluation if the symptoms worsen or if the condition fails to improve as anticipated. ? ?I provided 11 minutes of non-face-to-face time during this encounter including median intraservice time, reviewing previous notes, labs, imaging, medications and explaining diagnosis and management. ? ?Gildardo Pounds, FNP-BC  ?

## 2021-04-29 ENCOUNTER — Emergency Department (HOSPITAL_COMMUNITY): Payer: Medicaid Other

## 2021-04-29 ENCOUNTER — Other Ambulatory Visit: Payer: Self-pay

## 2021-04-29 ENCOUNTER — Ambulatory Visit: Payer: Medicaid Other | Admitting: Physician Assistant

## 2021-04-29 ENCOUNTER — Ambulatory Visit: Payer: Self-pay

## 2021-04-29 ENCOUNTER — Emergency Department (HOSPITAL_COMMUNITY)
Admission: EM | Admit: 2021-04-29 | Discharge: 2021-04-29 | Disposition: A | Discharge status: Home / Self Care | Payer: Medicaid Other | Attending: Emergency Medicine | Admitting: Emergency Medicine

## 2021-04-29 ENCOUNTER — Encounter (HOSPITAL_COMMUNITY): Payer: Self-pay | Admitting: Emergency Medicine

## 2021-04-29 DIAGNOSIS — J45909 Unspecified asthma, uncomplicated: Secondary | ICD-10-CM | POA: Diagnosis not present

## 2021-04-29 DIAGNOSIS — R079 Chest pain, unspecified: Secondary | ICD-10-CM | POA: Diagnosis present

## 2021-04-29 LAB — CBC
HCT: 38.4 % (ref 36.0–46.0)
Hemoglobin: 12.8 g/dL (ref 12.0–15.0)
MCH: 28.8 pg (ref 26.0–34.0)
MCHC: 33.3 g/dL (ref 30.0–36.0)
MCV: 86.3 fL (ref 80.0–100.0)
Platelets: 352 10*3/uL (ref 150–400)
RBC: 4.45 MIL/uL (ref 3.87–5.11)
RDW: 12.1 % (ref 11.5–15.5)
WBC: 6.6 10*3/uL (ref 4.0–10.5)
nRBC: 0 % (ref 0.0–0.2)

## 2021-04-29 LAB — BASIC METABOLIC PANEL
Anion gap: 5 (ref 5–15)
BUN: 9 mg/dL (ref 6–20)
CO2: 27 mmol/L (ref 22–32)
Calcium: 8.7 mg/dL — ABNORMAL LOW (ref 8.9–10.3)
Chloride: 109 mmol/L (ref 98–111)
Creatinine, Ser: 0.82 mg/dL (ref 0.44–1.00)
GFR, Estimated: >60 mL/min (ref >60)
Glucose, Bld: 96 mg/dL (ref 70–99)
Potassium: 3.4 mmol/L — ABNORMAL LOW (ref 3.5–5.1)
Sodium: 141 mmol/L (ref 135–145)

## 2021-04-29 LAB — I-STAT BETA HCG BLOOD, ED (MC, WL, AP ONLY): I-stat hCG, quantitative: <5 m[IU]/mL (ref <5)

## 2021-04-29 LAB — TROPONIN I (HIGH SENSITIVITY): Troponin I (High Sensitivity): <2 ng/L (ref <18)

## 2021-04-29 MED ORDER — PANTOPRAZOLE SODIUM 20 MG PO TBEC: 40.0000 mg | DELAYED_RELEASE_TABLET | Freq: Every day | ORAL | 0 refills | Status: DC

## 2021-04-29 NOTE — ED Provider Notes (Signed)
?[facility] DEPT ?Provider Note ? ? ?CSN: [id number] ?Arrival date & time: 04/29/21  1829 ? ?  ? ?History ? ?Chief Complaint  ?Patient presents with  ? Chest Pain  ? ? ?Becky Day is a 21 y.o. female. ? ?HPI ? ?  ? ?Yesterday 5PM developed chest pain, sharp pain left side ?Painful eating ?Pain today worse compared to yesterday ?Left chest, epigastrium to neck  ?Coming and going, feels it now but the pain coming up and down comes and goes, worse with swallowing and eating  ? ?No nausea, vomiting, dyspnea, cough, fever, leg pain or swelling, back pain, abdominal pain, urinary symptoms ? ?No smoking, uses chewing tobacco, no other etoh or drugs ?No family hx of early heart disease, mom has COPD, when young had asthma ? ?Past Medical History:  ?Diagnosis Date  ? Anxiety   ?  ? ?Home Medications ?Prior to Admission medications   ?Medication Sig Start Date End Date Taking? Authorizing Provider  ?pantoprazole (PROTONIX) 20 MG tablet Take 2 tablets (40 mg total) by mouth daily for 14 days. 04/29/21 05/13/21 Yes Gareth Morgan, MD  ?escitalopram (LEXAPRO) 10 MG tablet Take 1 tablet (10 mg total) by mouth daily. 04/27/21   Gildardo Pounds, NP  ?norethindrone-ethinyl estradiol (NORTREL 7/7/7) 0.5/0.75/1-35 MG-MCG tablet Take 1 tablet by mouth daily. 01/15/21   Gildardo Pounds, NP  ?   ? ?Allergies    ?Amoxicillin   ? ?Review of Systems   ?Review of Systems ? ?Physical Exam ?Updated Vital Signs ?BP 124/81   Pulse 86   Temp 98.1 ?F (36.7 ?C) (Oral)   Resp 16   SpO2 99%  ?Physical Exam ?Vitals and nursing note reviewed.  ?Constitutional:   ?   General: She is not in acute distress. ?   Appearance: She is well-developed. She is not diaphoretic.  ?HENT:  ?   Head: Normocephalic and atraumatic.  ?Eyes:  ?   Conjunctiva/sclera: Conjunctivae normal.  ?Cardiovascular:  ?   Rate and Rhythm: Normal rate and regular rhythm.  ?   Heart sounds: Normal heart sounds. No murmur heard. ?  No friction rub. No  gallop.  ?Pulmonary:  ?   Effort: Pulmonary effort is normal. No respiratory distress.  ?   Breath sounds: Normal breath sounds. No wheezing or rales.  ?Abdominal:  ?   General: There is no distension.  ?   Palpations: Abdomen is soft.  ?   Tenderness: There is no abdominal tenderness. There is no guarding.  ?Musculoskeletal:     ?   General: No tenderness.  ?   Cervical back: Normal range of motion.  ?Skin: ?   General: Skin is warm and dry.  ?   Findings: No erythema or rash.  ?Neurological:  ?   Mental Status: She is alert and oriented to person, place, and time.  ? ? ?ED Results / Procedures / Treatments   ?Labs ?(all labs ordered are listed, but only abnormal results are displayed) ?Labs Reviewed  ?BASIC METABOLIC PANEL - Abnormal; Notable for the following components:  ?    Result Value  ? Potassium 3.4 (*)   ? Calcium 8.7 (*)   ? All other components within normal limits  ?CBC  ?I-STAT BETA HCG BLOOD, ED ([facility], [facility], [facility] ONLY)  ?TROPONIN I (HIGH SENSITIVITY)  ?TROPONIN I (HIGH SENSITIVITY)  ? ? ?EKG ?EKG Interpretation ? ?Date/Time:  Thursday April 29 2021 18:35:12 EDT ?Ventricular Rate:  70 ?PR Interval:  123 ?  QRS Duration: 92 ?QT Interval:  362 ?QTC Calculation: 391 ?R Axis:   89 ?Text Interpretation: Sinus rhythm No old tracing to compare Confirmed by Daleen Bo [phone]) on 04/29/2021 6:50:51 PM ? ?Radiology ?DG Chest 2 View ? ?Result Date: 04/29/2021 ?CLINICAL DATA:  Chest pain EXAM: CHEST - 2 VIEW COMPARISON:  None. FINDINGS: Lungs are clear.  No pleural effusion or pneumothorax. The heart is normal in size. Visualized osseous structures are within normal limits. IMPRESSION: Normal chest radiographs. Electronically Signed   By: Julian Hy M.D.   On: 04/29/2021 19:13   ? ?Procedures ?Procedures  ? ? ?Medications Ordered in ED ?Medications - No data to display ? ?ED Course/ Medical Decision Making/ A&P ?  ?                        ?Medical Decision Making ?Amount and/or Complexity of Data Reviewed ?Labs:  ordered. ?Radiology: ordered. ? ?Risk ?Prescription drug management. ? ? ?21 year old female with history childhood asthma and anxiety presents with concern of chest pain. Differential diagnosis for chest pain includes pulmonary embolus, dissection, pneumothorax, pneumonia, ACS, myocarditis, pericarditis.  EKG was done and evaluate by me and showed no acute ST changes and no signs of pericarditis. Chest x-ray was done and evaluated by me and radiology and showed no sign of pneumonia or pneumothorax. NO dyspnea, no asymmetric leg swelling, no tachycardia, no hypoxia have low suspicion for PE.  Patient is low risk HEART score and troponin negative after 6 hours of symptoms and doubt ACS.  Do not feel history or exam are consistent with aortic dissection with normal pulses, no abnormality on XR, pain worse with swallowing. Labs personally reviewed by me. ? ?Suspect esophageal pathology/GERD most likely . Do not see signs of emergent pathology to recommend admission/surgery.  Will start PPI, recommend PCP follow up. Patient discharged in stable condition with understanding of reasons to return.  ? ? ? ? ? ? ? ?Final Clinical Impression(s) / ED Diagnoses ?Final diagnoses:  ?Nonspecific chest pain  ? ? ?Rx / DC Orders ?ED Discharge Orders   ? ?      Ordered  ?  pantoprazole (PROTONIX) 20 MG tablet  Daily       ? 04/29/21 1952  ? ?  ?  ? ?  ? ? ?  ?Gareth Morgan, MD ?04/30/21 [phone] ? ?

## 2021-04-29 NOTE — Telephone Encounter (Signed)
Pt has been called and informed that provider is not in office and appointment will be canceled she was informed to go to Urgent care or ED for evaluation. ?

## 2021-04-29 NOTE — Telephone Encounter (Signed)
?  Chief Complaint: right side of chest pain and pain with swallowing ?Symptoms: pain radiates to right side of neck  ?Frequency: yesterday ?Pertinent Negatives: Patient denies SOB. Pain with breathing.dizziness, nausea  or vomiting, sweating, fever ?Disposition: [] ED /[] Urgent Care (no appt availability in office) / [x] Appointment(In office/virtual)/ []  [facility]/ [] Home Care/ [] Refused Recommended Disposition /[] [facility]/ []  Follow-up with PCP ?Additional Notes: Pt given appt in office today ? ? ? ?Reason for Disposition ? [1] Chest pain lasts < 5 minutes AND [2] NO chest pain or cardiac symptoms (e.g., breathing difficulty, sweating) now (Exception: chest pains that last only a few seconds) ? ?Answer Assessment - Initial Assessment Questions ?1. LOCATION: "Where does it hurt?"   ?    Right side of chest, hurts with swallow ?2. RADIATION: "Does the pain go anywhere else?" (e.g., into neck, jaw, arms, back) ?   Right side of neck ?3. ONSET: "When did the chest pain begin?" (Minutes, hours or days)  ?    yesterday ?4. PATTERN "Does the pain come and go, or has it been constant since it started?"  "Does it get worse with exertion?"  ?    Comes and goes every 5 to 10 minutes ?5. DURATION: "How long does it last" (e.g., seconds, minutes, hours) ?    5 to 10 ?6. SEVERITY: "How bad is the pain?"  (e.g., Scale 1-10; mild, moderate, or severe) ?   - MILD (1-3): doesn't interfere with normal activities  ?   - MODERATE (4-7): interferes with normal activities or awakens from sleep ?   - SEVERE (8-10): excruciating pain, unable to do any normal activities   ?    moderate ?7. CARDIAC RISK FACTORS: "Do you have any history of heart problems or risk factors for heart disease?" (e.g., angina, prior heart attack; diabetes, high blood pressure, high cholesterol, smoker, or strong family history of heart disease) ?    no ?8. PULMONARY RISK FACTORS: "Do you have any history of lung disease?"  (e.g., blood  clots in lung, asthma, emphysema, birth control pills) ?    no ?9. CAUSE: "What do you think is causing the chest pain?" ?    Unsure pain anxiety versus a physical issue ?10. OTHER SYMPTOMS: "Do you have any other symptoms?" (e.g., dizziness, nausea, vomiting, sweating, fever, difficulty breathing, cough) ?      Anxiety  ?11. PREGNANCY: "Is there any chance you are pregnant?" "When was your last menstrual period?" ?      *No Answer* ? ?Protocols used: Chest Pain-A-AH ? ?

## 2021-04-29 NOTE — ED Triage Notes (Signed)
Pt reports chest pain that radiates into jaw since yesterday.  ?

## 2021-05-18 ENCOUNTER — Other Ambulatory Visit: Payer: Self-pay | Admitting: Nurse Practitioner

## 2021-05-18 DIAGNOSIS — F419 Anxiety disorder, unspecified: Secondary | ICD-10-CM

## 2021-06-29 ENCOUNTER — Encounter: Payer: Self-pay | Admitting: Nurse Practitioner

## 2021-07-12 ENCOUNTER — Other Ambulatory Visit: Payer: Self-pay | Admitting: Nurse Practitioner

## 2021-07-12 ENCOUNTER — Encounter: Payer: Self-pay | Admitting: Nurse Practitioner

## 2021-07-12 DIAGNOSIS — Z0100 Encounter for examination of eyes and vision without abnormal findings: Secondary | ICD-10-CM

## 2021-07-12 DIAGNOSIS — Z0120 Encounter for dental examination and cleaning without abnormal findings: Secondary | ICD-10-CM

## 2021-07-14 ENCOUNTER — Other Ambulatory Visit: Payer: Self-pay | Admitting: Nurse Practitioner

## 2021-07-14 DIAGNOSIS — K089 Disorder of teeth and supporting structures, unspecified: Secondary | ICD-10-CM

## 2021-08-09 ENCOUNTER — Ambulatory Visit: Payer: Self-pay | Admitting: *Deleted

## 2021-08-09 NOTE — Telephone Encounter (Signed)
Scheduled apt for patient with PCP on 08/09/2021 at 0930

## 2021-08-09 NOTE — Telephone Encounter (Signed)
  Chief Complaint: requesting to change dose of medication , increasing depression Symptoms: crying at random times. Moody, depressed more, no appetite. Frequency: approx 1 month  Pertinent Negatives: Patient denies harming self or others. Disposition: [] ED /[] Urgent Care (no appt availability in office) / [] Appointment(In office/virtual)/ [x]  [facility]/ [] Home Care/ [] Refused Recommended Disposition /[] [facility]/ [x]  Follow-up with PCP Additional Notes:   Recommended urgent crisis center and gave #. Recommended if sx worsening and thoughts of hurting self or others text "[id number]". No available appt until 08/18/21. Requesting My Chart VV. Please advise if adjusting dose of medication needed.     Reason for Disposition  [1] Depression AND [2] worsening (e.g., sleeping poorly, less able to do activities of daily living)  Answer Assessment - Initial Assessment Questions 1. CONCERN: "What happened that made you call today?"     Requesting medication dose adjusted 2. DEPRESSION SYMPTOM SCREENING: "How are you feeling overall?" (e.g., decreased energy, increased sleeping or difficulty sleeping, difficulty concentrating, feelings of sadness, guilt, hopelessness, or worthlessness)     Depression feeling of hopelessness, moody, crying at random times  3. RISK OF HARM - SUICIDAL IDEATION:  "Do you ever have thoughts of hurting or killing yourself?"  (e.g., yes, no, no but preoccupation with thoughts about death)   - INTENT:  "Do you have thoughts of hurting or killing yourself right NOW?" (e.g., yes, no, N/A)   - PLAN: "Do you have a specific plan for how you would do this?" (e.g., gun, knife, overdose, no plan, N/A)     Denies  4. RISK OF HARM - HOMICIDAL IDEATION:  "Do you ever have thoughts of hurting or killing someone else?"  (e.g., yes, no, no but preoccupation with thoughts about death)   - INTENT:  "Do you have thoughts of hurting or killing someone right NOW?" (e.g., yes,  no, N/A)   - PLAN: "Do you have a specific plan for how you would do this?" (e.g., gun, knife, no plan, N/A)      Denies  5. FUNCTIONAL IMPAIRMENT: "How have things been going for you overall? Have you had more difficulty than usual doing your normal daily activities?"  (e.g., better, same, worse; self-care, school, work, interactions)     No appetite 6. SUPPORT: "Who is with you now?" "Who do you live with?" "Do you have family or friends who you can talk to?"      Family  7. THERAPIST: "Do you have a counselor or therapist? Name?"     na 8. STRESSORS: "Has there been any new stress or recent changes in your life?"     na 9. ALCOHOL USE OR SUBSTANCE USE (DRUG USE): "Do you drink alcohol or use any illegal drugs?"     No  10. OTHER: "Do you have any other physical symptoms right now?" (e.g., fever)       No appetite crying at random times 11. PREGNANCY: "Is there any chance you are pregnant?" "When was your last menstrual period?"       On birth control,LMP  past weekend stopped period  Protocols used: Depression-A-AH

## 2021-08-10 ENCOUNTER — Telehealth (HOSPITAL_BASED_OUTPATIENT_CLINIC_OR_DEPARTMENT_OTHER): Payer: Medicaid Other | Admitting: Nurse Practitioner

## 2021-08-10 ENCOUNTER — Encounter: Payer: Self-pay | Admitting: Nurse Practitioner

## 2021-08-10 DIAGNOSIS — F419 Anxiety disorder, unspecified: Secondary | ICD-10-CM | POA: Diagnosis not present

## 2021-08-10 DIAGNOSIS — F32A Depression, unspecified: Secondary | ICD-10-CM

## 2021-08-10 MED ORDER — FLUOXETINE HCL 10 MG PO CAPS: 10.0000 mg | ORAL_CAPSULE | Freq: Every day | ORAL | 3 refills | Status: DC

## 2021-08-10 NOTE — Progress Notes (Signed)
Virtual Visit Note  I discussed the limitations, risks, security and privacy concerns of performing an evaluation and management service by video and the availability of in person appointments. I also discussed with the patient that there may be a patient responsible charge related to this service. The patient expressed understanding and agreed to proceed.    I connected with Becky Day on 08/10/21  at   9:30 AM EDT  EDT by VIDEO and verified that I am speaking with the correct person using two identifiers.   Location of Patient: Private Residence   Location of Provider: [facility]    Persons participating in VIRTUAL visit: [name] FNP-BC Becky Day    History of Present Illness: VIRTUAL visit for: Anxiety and Depression  Becky Day was started on lexapro 10mg  several months ago.  Unfortunately 10 mg of lexapro left her fatigued and with no energy. We attempted to decrease lexapro to 5 mg and her anxiety and depression worsened on the lower dose. Will switch to prozac today. She is currently seeing a therapist for her mood disorder.   Past Medical History:  Diagnosis Date   Anxiety     Past Surgical History:  Procedure Laterality Date   NO PAST SURGERIES      Family History  Problem Relation Age of Onset   Bipolar disorder Mother    Bipolar disorder Sister    Bipolar disorder Sister    Bipolar disorder Sister     Social History   Socioeconomic History   Marital status: Single    Spouse name: Not on file   Number of children: Not on file   Years of education: Not on file   Highest education level: Not on file  Occupational History   Not on file  Tobacco Use   Smoking status: Never   Smokeless tobacco: Never  Substance and Sexual Activity   Alcohol use: Not Currently   Drug use: Not Currently   Sexual activity: Yes  Other Topics Concern   Not on file  Social History Narrative   Not on file   Social Determinants of  Health   Financial Resource Strain: Not on file  Food Insecurity: Not on file  Transportation Needs: Not on file  Physical Activity: Not on file  Stress: Not on file  Social Connections: Not on file     Observations/Objective: Awake, alert and oriented x 3   Review of Systems  Constitutional:  Negative for fever, malaise/fatigue and weight loss.  HENT: Negative.  Negative for nosebleeds.   Eyes: Negative.  Negative for blurred vision, double vision and photophobia.  Respiratory: Negative.  Negative for cough and shortness of breath.   Cardiovascular: Negative.  Negative for chest pain, palpitations and leg swelling.  Gastrointestinal: Negative.  Negative for heartburn, nausea and vomiting.  Musculoskeletal: Negative.  Negative for myalgias.  Neurological: Negative.  Negative for dizziness, focal weakness, seizures and headaches.  Psychiatric/Behavioral:  Positive for depression. Negative for suicidal ideas. The patient is nervous/anxious.     Assessment and Plan: Diagnoses and all orders for this visit:  Anxiety and depression -     FLUoxetine (PROZAC) 10 MG capsule; Take 1 capsule (10 mg total) by mouth daily. Stop lexapro.     Follow Up Instructions Return in about 4 weeks (around 09/07/2021) for phq9.     I discussed the assessment and treatment plan with the patient. The patient was provided an opportunity to ask questions and all were  answered. The patient agreed with the plan and demonstrated an understanding of the instructions.   The patient was advised to call back or seek an in-person evaluation if the symptoms worsen or if the condition fails to improve as anticipated.  I provided 11 minutes of face-to-face time during this encounter including median intraservice time, reviewing previous notes, labs, imaging, medications and explaining diagnosis and management.  Claiborne Rigg, FNP-BC

## 2021-09-07 ENCOUNTER — Telehealth (HOSPITAL_BASED_OUTPATIENT_CLINIC_OR_DEPARTMENT_OTHER): Payer: Medicaid Other | Admitting: Nurse Practitioner

## 2021-09-07 ENCOUNTER — Encounter: Payer: Self-pay | Admitting: Nurse Practitioner

## 2021-09-07 DIAGNOSIS — F419 Anxiety disorder, unspecified: Secondary | ICD-10-CM | POA: Diagnosis not present

## 2021-09-07 DIAGNOSIS — F32A Depression, unspecified: Secondary | ICD-10-CM

## 2021-09-07 NOTE — Progress Notes (Signed)
Virtual Visit Note  I discussed the limitations, risks, security and privacy concerns of performing an evaluation and management service by video and the availability of in person appointments. I also discussed with the patient that there may be a patient responsible charge related to this service. The patient expressed understanding and agreed to proceed.    I connected with Becky Day on 09/07/21  at   9:50 AM EDT  EDT by VIDEO and verified that I am speaking with the correct person using two identifiers.   Location of Patient: Private Residence   Location of Provider: [facility]    Persons participating in VIRTUAL visit: [name] FNP-BC NYLEAH MCGINNIS    History of Present Illness: VIRTUAL visit for: Anxiety and Depression  Currently taking prozac 10 mg daily. She has a therapist that she sees for cognitive therapy. She has taken lexapro iin the past but felt fatigued with little energy with taking. We attempted to decrease lexapro to 5 mg and her anxiety and depression worsened on the lower dose so we started her on prozac last month    09/07/2021   10:00 AM 04/27/2021    1:53 PM 03/23/2021   12:58 PM  Depression screen PHQ 2/9  Decreased Interest 1 0 1  Down, Depressed, Hopeless 1 0 1  PHQ - 2 Score 2 0 2  Altered sleeping 2 1 3   Tired, decreased energy 0 1 3  Change in appetite 0 1 2  Feeling bad or failure about yourself  1 0 1  Trouble concentrating 0 0 0  Moving slowly or fidgety/restless 0 0 0  Suicidal thoughts 0 0 1  PHQ-9 Score 5 3 12   Difficult doing work/chores Somewhat difficult Not difficult at all Very difficult     Past Medical History:  Diagnosis Date   Anxiety     Past Surgical History:  Procedure Laterality Date   NO PAST SURGERIES      Family History  Problem Relation Age of Onset   Bipolar disorder Mother    Bipolar disorder Sister    Bipolar disorder Sister    Bipolar disorder Sister     Social History    Socioeconomic History   Marital status: Single    Spouse name: Not on file   Number of children: Not on file   Years of education: Not on file   Highest education level: Not on file  Occupational History   Not on file  Tobacco Use   Smoking status: Never   Smokeless tobacco: Never  Substance and Sexual Activity   Alcohol use: Not Currently   Drug use: Not Currently   Sexual activity: Yes  Other Topics Concern   Not on file  Social History Narrative   Not on file   Social Determinants of Health   Financial Resource Strain: Not on file  Food Insecurity: Not on file  Transportation Needs: Not on file  Physical Activity: Not on file  Stress: Not on file  Social Connections: Not on file     Observations/Objective: Awake, alert and oriented x 3   Review of Systems  Constitutional:  Negative for fever, malaise/fatigue and weight loss.  HENT: Negative.  Negative for nosebleeds.   Eyes: Negative.  Negative for blurred vision, double vision and photophobia.  Respiratory: Negative.  Negative for cough and shortness of breath.   Cardiovascular: Negative.  Negative for chest pain, palpitations and leg swelling.  Gastrointestinal: Negative.  Negative for heartburn,  nausea and vomiting.  Musculoskeletal: Negative.  Negative for myalgias.  Neurological: Negative.  Negative for dizziness, focal weakness, seizures and headaches.  Psychiatric/Behavioral:  Negative for suicidal ideas.     Assessment and Plan: Diagnoses and all orders for this visit:  Anxiety and depression Continue current dose of prozac Based on scores will not increase today. Will re evaluate new phq9 scores in 6-8 weeks    Follow Up Instructions Return in about 6 weeks (around 10/19/2021) for anxiety and depression. .     I discussed the assessment and treatment plan with the patient. The patient was provided an opportunity to ask questions and all were answered. The patient agreed with the plan and  demonstrated an understanding of the instructions.   The patient was advised to call back or seek an in-person evaluation if the symptoms worsen or if the condition fails to improve as anticipated.  I provided 11 minutes of face-to-face time during this encounter including median intraservice time, reviewing previous notes, labs, imaging, medications and explaining diagnosis and management.  Claiborne Rigg, FNP-BC

## 2021-09-25 ENCOUNTER — Encounter: Payer: Self-pay | Admitting: Nurse Practitioner

## 2021-10-06 ENCOUNTER — Other Ambulatory Visit: Payer: Self-pay | Admitting: Nurse Practitioner

## 2021-10-06 DIAGNOSIS — F419 Anxiety disorder, unspecified: Secondary | ICD-10-CM

## 2021-10-06 MED ORDER — FLUOXETINE HCL 20 MG PO CAPS: 20.0000 mg | ORAL_CAPSULE | Freq: Every day | ORAL | 0 refills | Status: DC

## 2021-10-06 NOTE — Telephone Encounter (Signed)
Pt states she has already been taking 20 mg prozac, and has a week left.  Would lik a new Rx for 20 mg prozac sent to  [facility], [place] - [address] AT [address]

## 2021-10-19 ENCOUNTER — Encounter: Payer: Self-pay | Admitting: Nurse Practitioner

## 2021-10-19 ENCOUNTER — Telehealth (HOSPITAL_BASED_OUTPATIENT_CLINIC_OR_DEPARTMENT_OTHER): Payer: Medicaid Other | Admitting: Nurse Practitioner

## 2021-10-19 ENCOUNTER — Telehealth: Payer: Self-pay | Admitting: Emergency Medicine

## 2021-10-19 DIAGNOSIS — F419 Anxiety disorder, unspecified: Secondary | ICD-10-CM

## 2021-10-19 DIAGNOSIS — F32A Depression, unspecified: Secondary | ICD-10-CM

## 2021-10-19 MED ORDER — HYDROXYZINE HCL 10 MG PO TABS: 10.0000 mg | ORAL_TABLET | Freq: Three times a day (TID) | ORAL | 1 refills | Status: DC | PRN

## 2021-10-19 NOTE — Telephone Encounter (Signed)
Patients call returned.

## 2021-10-19 NOTE — Telephone Encounter (Signed)
Copied from [place] [phone]. Topic: Appointment Scheduling - Scheduling Inquiry for Clinic >> Oct 19, 2021 10:04 AM Jordan Hawks B wrote: Reason for CRM: Patient called in for her 9:50 video appt. Please call back

## 2021-10-19 NOTE — Progress Notes (Signed)
Virtual Visit Note  I discussed the limitations, risks, security and privacy concerns of performing an evaluation and management service by video and the availability of in person appointments. I also discussed with the patient that there may be a patient responsible charge related to this service. The patient expressed understanding and agreed to proceed.    I connected with Becky Day on 10/19/21  at   9:50 AM EDT  EDT by VIDEO and verified that I am speaking with the correct person using two identifiers.   Location of Patient: Private Residence   Location of Provider: [place] [facility] [place] participating in VIRTUAL visit: [name] FNP-BC Becky Day    History of Present Illness: VIRTUAL visit for: anxiety and depression  Currently taking prozac and feels her depression is controlled however her anxiety has worsened with associated panic attacks since she had several of her wisdom teeth extracted a few weeks ago. Anxiety has increased as she has thoughts of possible choking while eating or suffocating in her sleep. She is currently being followed by behavioral health for psychotherapy     09/07/2021   10:00 AM 04/27/2021    1:53 PM 03/23/2021   12:58 PM  Depression screen PHQ 2/9  Decreased Interest 1 0 1  Down, Depressed, Hopeless 1 0 1  PHQ - 2 Score 2 0 2  Altered sleeping 2 1 3   Tired, decreased energy 0 1 3  Change in appetite 0 1 2  Feeling bad or failure about yourself  1 0 1  Trouble concentrating 0 0 0  Moving slowly or fidgety/restless 0 0 0  Suicidal thoughts 0 0 1  PHQ-9 Score 5 3 12   Difficult doing work/chores Somewhat difficult Not difficult at all Very difficult      Past Medical History:  Diagnosis Date   Anxiety     Past Surgical History:  Procedure Laterality Date   NO PAST SURGERIES      Family History  Problem Relation Age of Onset   Bipolar disorder Mother    Bipolar disorder Sister    Bipolar disorder  Sister    Bipolar disorder Sister     Social History   Socioeconomic History   Marital status: Single    Spouse name: Not on file   Number of children: Not on file   Years of education: Not on file   Highest education level: Not on file  Occupational History   Not on file  Tobacco Use   Smoking status: Never   Smokeless tobacco: Never  Substance and Sexual Activity   Alcohol use: Not Currently   Drug use: Not Currently   Sexual activity: Yes  Other Topics Concern   Not on file  Social History Narrative   Not on file   Social Determinants of Health   Financial Resource Strain: Not on file  Food Insecurity: Not on file  Transportation Needs: Not on file  Physical Activity: Not on file  Stress: Not on file  Social Connections: Not on file     Observations/Objective: Awake, alert and oriented x 3   Review of Systems  Constitutional:  Negative for fever, malaise/fatigue and weight loss.  HENT: Negative.  Negative for nosebleeds.   Eyes: Negative.  Negative for blurred vision, double vision and photophobia.  Respiratory: Negative.  Negative for cough and shortness of breath.   Cardiovascular: Negative.  Negative for chest pain, palpitations and leg swelling.  Gastrointestinal: Negative.  Negative for heartburn, nausea and vomiting.  Musculoskeletal: Negative.  Negative for myalgias.  Neurological: Negative.  Negative for dizziness, focal weakness, seizures and headaches.  Psychiatric/Behavioral:  Positive for depression. Negative for suicidal ideas. The patient is nervous/anxious and has insomnia.     Assessment and Plan: Diagnoses and all orders for this visit:  Anxiety and depression -     hydrOXYzine (ATARAX) 10 MG tablet; Take 1 tablet (10 mg total) by mouth 3 (three) times daily as needed.   Continue prozac as prescribed  Follow Up Instructions Return in about 4 weeks (around 11/16/2021).     I discussed the assessment and treatment plan with the patient.  The patient was provided an opportunity to ask questions and all were answered. The patient agreed with the plan and demonstrated an understanding of the instructions.   The patient was advised to call back or seek an in-person evaluation if the symptoms worsen or if the condition fails to improve as anticipated.  I provided 11 minutes of face-to-face time during this encounter including median intraservice time, reviewing previous notes, labs, imaging, medications and explaining diagnosis and management.  Claiborne Rigg, FNP-BC

## 2021-11-15 ENCOUNTER — Encounter: Payer: Self-pay | Admitting: Nurse Practitioner

## 2021-11-15 ENCOUNTER — Ambulatory Visit (HOSPITAL_BASED_OUTPATIENT_CLINIC_OR_DEPARTMENT_OTHER): Payer: Medicaid Other | Admitting: Nurse Practitioner

## 2021-11-15 DIAGNOSIS — F410 Panic disorder [episodic paroxysmal anxiety] without agoraphobia: Secondary | ICD-10-CM

## 2021-11-15 DIAGNOSIS — F4310 Post-traumatic stress disorder, unspecified: Secondary | ICD-10-CM | POA: Diagnosis not present

## 2021-11-15 DIAGNOSIS — F514 Sleep terrors [night terrors]: Secondary | ICD-10-CM

## 2021-11-15 MED ORDER — PRAZOSIN HCL 1 MG PO CAPS: 1.0000 mg | ORAL_CAPSULE | Freq: Every day | ORAL | 1 refills | Status: DC

## 2021-11-15 NOTE — Progress Notes (Signed)
Virtual Visit via Telephone Note  I discussed the limitations, risks, security and privacy concerns of performing an evaluation and management service by telephone and the availability of in person appointments. I also discussed with the patient that there may be a patient responsible charge related to this service. The patient expressed understanding and agreed to proceed.    I connected with Becky Day on 11/15/21  at   2:50 PM EST  EDT by telephone and verified that I am speaking with the correct person using two identifiers.  Location of Patient: Private Residence   Location of Provider: [facility] [place] participating in Telemedicine visit: [name] FNP-BC Becky Day    History of Present Illness: Telemedicine visit for: Anxiety and Depression    Currently taking prozac 20mg  daily and feels her depression is controlled as well as anxiety with the addition of hydroxyzine last month however she is still having night terrors that wake her up out of her sleep several times per night.  She is seeing a behavioral health  therapist regularly    09/07/2021   10:00 AM 04/27/2021    1:53 PM 03/23/2021   12:58 PM  Depression screen PHQ 2/9  Decreased Interest 1 0 1  Down, Depressed, Hopeless 1 0 1  PHQ - 2 Score 2 0 2  Altered sleeping 2 1 3   Tired, decreased energy 0 1 3  Change in appetite 0 1 2  Feeling bad or failure about yourself  1 0 1  Trouble concentrating 0 0 0  Moving slowly or fidgety/restless 0 0 0  Suicidal thoughts 0 0 1  PHQ-9 Score 5 3 12   Difficult doing work/chores Somewhat difficult Not difficult at all Very difficult       04/27/2021    1:56 PM 03/23/2021    1:00 PM 02/21/2020    2:00 PM  GAD 7 : Generalized Anxiety Score  Nervous, Anxious, on Edge 1 2 1   Control/stop worrying 1 2 0  Worry too much - different things 1 2 0  Trouble relaxing 0 1 0  Restless 0 0 0  Easily annoyed or irritable 1 1 0  Afraid - awful  might happen 1 2 0  Total GAD 7 Score 5 10 1   Anxiety Difficulty Not difficult at all Somewhat difficult         Past Medical History:  Diagnosis Date   Anxiety     Past Surgical History:  Procedure Laterality Date   NO PAST SURGERIES      Family History  Problem Relation Age of Onset   Bipolar disorder Mother    Bipolar disorder Sister    Bipolar disorder Sister    Bipolar disorder Sister     Social History   Socioeconomic History   Marital status: Single    Spouse name: Not on file   Number of children: Not on file   Years of education: Not on file   Highest education level: Not on file  Occupational History   Not on file  Tobacco Use   Smoking status: Never   Smokeless tobacco: Never  Substance and Sexual Activity   Alcohol use: Not Currently   Drug use: Not Currently   Sexual activity: Yes  Other Topics Concern   Not on file  Social History Narrative   Not on file   Social Determinants of Health   Financial Resource Strain: Not on file  Food Insecurity: Not on  file  Transportation Needs: Not on file  Physical Activity: Not on file  Stress: Not on file  Social Connections: Not on file     Observations/Objective: Awake, alert and oriented x 3   Review of Systems  Constitutional:  Negative for fever, malaise/fatigue and weight loss.  HENT: Negative.  Negative for nosebleeds.   Eyes: Negative.  Negative for blurred vision, double vision and photophobia.  Respiratory: Negative.  Negative for cough and shortness of breath.   Cardiovascular: Negative.  Negative for chest pain, palpitations and leg swelling.  Gastrointestinal: Negative.  Negative for heartburn, nausea and vomiting.  Musculoskeletal: Negative.  Negative for myalgias.  Neurological: Negative.  Negative for dizziness, focal weakness, seizures and headaches.  Psychiatric/Behavioral:  Positive for depression. Negative for hallucinations, memory loss, substance abuse and suicidal ideas. The  patient is nervous/anxious and has insomnia.     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  Night terrors -     prazosin (MINIPRESS) 1 MG capsule; Take 1 capsule (1 mg total) by mouth at bedtime.     Follow Up Instructions Return in about 6 weeks (around 12/27/2021).     I discussed the assessment and treatment plan with the patient. The patient was provided an opportunity to ask questions and all were answered. The patient agreed with the plan and demonstrated an understanding of the instructions.   The patient was advised to call back or seek an in-person evaluation if the symptoms worsen or if the condition fails to improve as anticipated.  I provided 11 minutes of non-face-to-face time during this encounter including median intraservice time, reviewing previous notes, labs, imaging, medications and explaining diagnosis and management.  Claiborne Rigg, FNP-BC

## 2021-11-27 ENCOUNTER — Other Ambulatory Visit: Payer: Self-pay | Admitting: Nurse Practitioner

## 2021-11-27 DIAGNOSIS — Z30011 Encounter for initial prescription of contraceptive pills: Secondary | ICD-10-CM

## 2021-11-29 NOTE — Telephone Encounter (Signed)
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

## 2021-11-30 ENCOUNTER — Telehealth: Payer: Self-pay | Admitting: Nurse Practitioner

## 2021-11-30 ENCOUNTER — Other Ambulatory Visit: Payer: Self-pay

## 2021-11-30 ENCOUNTER — Other Ambulatory Visit: Payer: Self-pay | Admitting: Nurse Practitioner

## 2021-11-30 DIAGNOSIS — F4310 Post-traumatic stress disorder, unspecified: Secondary | ICD-10-CM

## 2021-11-30 DIAGNOSIS — F514 Sleep terrors [night terrors]: Secondary | ICD-10-CM

## 2021-11-30 DIAGNOSIS — F410 Panic disorder [episodic paroxysmal anxiety] without agoraphobia: Secondary | ICD-10-CM

## 2021-11-30 DIAGNOSIS — F32A Depression, unspecified: Secondary | ICD-10-CM

## 2021-11-30 NOTE — Telephone Encounter (Signed)
Pt advised that a referral will be sent into behavioral health and number to make an appointment was given.

## 2021-11-30 NOTE — Telephone Encounter (Signed)
Pt called and stated that the prazosin (MINIPRESS) 1 MG capsule is not working and just makes her more sleepy / please advise

## 2021-12-05 ENCOUNTER — Encounter: Payer: Self-pay | Admitting: Nurse Practitioner

## 2021-12-08 ENCOUNTER — Other Ambulatory Visit: Payer: Self-pay | Admitting: Nurse Practitioner

## 2021-12-08 DIAGNOSIS — F32A Depression, unspecified: Secondary | ICD-10-CM

## 2022-01-04 ENCOUNTER — Encounter: Payer: Self-pay | Admitting: Nurse Practitioner

## 2022-01-04 ENCOUNTER — Other Ambulatory Visit: Payer: Self-pay | Admitting: Nurse Practitioner

## 2022-01-04 DIAGNOSIS — Z30011 Encounter for initial prescription of contraceptive pills: Secondary | ICD-10-CM

## 2022-01-04 DIAGNOSIS — F419 Anxiety disorder, unspecified: Secondary | ICD-10-CM

## 2022-01-04 MED ORDER — NORTREL 7/7/7 0.5/0.75/1-35 MG-MCG PO TABS: 1.0000 | ORAL_TABLET | Freq: Every day | ORAL | 11 refills | Status: DC

## 2022-01-04 MED ORDER — FLUOXETINE HCL 20 MG PO CAPS: 20.0000 mg | ORAL_CAPSULE | Freq: Every day | ORAL | 3 refills | Status: DC

## 2022-03-14 ENCOUNTER — Other Ambulatory Visit: Payer: Self-pay | Admitting: Nurse Practitioner

## 2022-03-14 DIAGNOSIS — F419 Anxiety disorder, unspecified: Secondary | ICD-10-CM

## 2022-03-15 MED ORDER — HYDROXYZINE HCL 10 MG PO TABS: 10.0000 mg | ORAL_TABLET | Freq: Three times a day (TID) | ORAL | 3 refills | Status: DC | PRN

## 2022-10-17 ENCOUNTER — Other Ambulatory Visit: Payer: Self-pay | Admitting: Nurse Practitioner

## 2022-10-17 DIAGNOSIS — Z30011 Encounter for initial prescription of contraceptive pills: Secondary | ICD-10-CM

## 2022-12-30 ENCOUNTER — Encounter: Payer: Self-pay | Admitting: Nurse Practitioner

## 2022-12-30 ENCOUNTER — Other Ambulatory Visit: Payer: Self-pay | Admitting: Nurse Practitioner

## 2022-12-30 DIAGNOSIS — Z30011 Encounter for initial prescription of contraceptive pills: Secondary | ICD-10-CM

## 2022-12-30 MED ORDER — NORTREL 7/7/7 0.5/0.75/1-35 MG-MCG PO TABS: 1.0000 | ORAL_TABLET | Freq: Every day | ORAL | 5 refills | Status: DC

## 2022-12-30 NOTE — Telephone Encounter (Signed)
6 months supply only. Needs to schedule an office visit.

## 2023-02-13 ENCOUNTER — Encounter (INDEPENDENT_AMBULATORY_CARE_PROVIDER_SITE_OTHER): Payer: Self-pay | Admitting: Primary Care

## 2023-02-13 ENCOUNTER — Ambulatory Visit (INDEPENDENT_AMBULATORY_CARE_PROVIDER_SITE_OTHER): Payer: Medicaid Other | Admitting: Primary Care

## 2023-02-13 ENCOUNTER — Other Ambulatory Visit (HOSPITAL_COMMUNITY)
Admission: RE | Admit: 2023-02-13 | Discharge: 2023-02-13 | Disposition: A | Discharge status: Home / Self Care | Payer: Medicaid Other | Source: Ambulatory Visit | Attending: Primary Care | Admitting: Primary Care

## 2023-02-13 VITALS — BP 127/85 | HR 79 | Resp 16 | Ht 65.0 in | Wt 164.4 lb

## 2023-02-13 DIAGNOSIS — Z124 Encounter for screening for malignant neoplasm of cervix: Secondary | ICD-10-CM | POA: Insufficient documentation

## 2023-02-13 DIAGNOSIS — Z2821 Immunization not carried out because of patient refusal: Secondary | ICD-10-CM

## 2023-02-13 DIAGNOSIS — Z113 Encounter for screening for infections with a predominantly sexual mode of transmission: Secondary | ICD-10-CM | POA: Diagnosis not present

## 2023-02-13 NOTE — Progress Notes (Signed)
  [facility]  WELL-WOMAN PHYSICAL & PAP Patient name: Becky Day MRN [id number]  Date of birth: 01/25/2000 Chief Complaint:   Gynecologic Exam  History of Present Illness:   Becky Day is a 23 y.o. No obstetric history on file. female being seen today for a routine well-woman exam.   CC: none  The current method of family planning is OCP (estrogen/progesterone).  Patient's last menstrual period was 01/16/2023. Last pap never .  Family h/o breast cancer: No Family h/o colorectal cancer: No  Health Maintenance  Topic Date Due   Pap Smear  Never done   Chlamydia screening  03/18/2022   Flu Shot  08/04/2022   COVID-19 Vaccine (1 - 2024-25 season) Never done   DTaP/Tdap/Td vaccine (1 - Tdap) 02/13/2024*   HPV Vaccine (1 - 3-dose series) 02/13/2024*   Hepatitis C Screening  Completed   HIV Screening  Completed  *Topic was postponed. The date shown is not the original due date.   Review of Systems:    Denies any headaches, blurred vision, fatigue, shortness of breath, chest pain, abdominal pain, abnormal vaginal discharge/itching/odor/irritation, problems with periods, bowel movements, urination, or intercourse unless otherwise stated above.  Pertinent History Reviewed:   Reviewed past medical,surgical, social and family history.  Reviewed problem list, medications and allergies.  Physical Assessment:   Vitals:   02/13/23 1426  BP: 127/85  Pulse: 79  Resp: 16  SpO2: 96%  Weight: 164 lb 6.4 oz (74.6 kg)  Height: 5\' 5"  (1.651 m)  Body mass index is 27.36 kg/m.        Physical Examination:  General appearance - well appearing, and in no distress Mental status - alert, oriented to person, place, and time Psych:  She has a normal mood and affect Skin - warm and dry, normal color, no suspicious lesions noted Chest - effort normal, all lung fields clear to auscultation bilaterally Heart - normal rate and regular rhythm Neck:  midline trachea, no  thyromegaly or nodules Breasts - Educated patient on proper self breast examination and had patient to demonstrate SBE. Abdomen - soft, nontender, nondistended, no masses or organomegaly Pelvic-VULVA: normal appearing vulva with no masses, tenderness or lesions   VAGINA: normal appearing vagina with normal color and discharge, no lesions   CERVIX: normal appearing cervix without discharge or lesions, no CMT UTERUS: uterus is felt to be normal size, shape, consistency and nontender  ADNEXA: No adnexal masses or tenderness noted. Extremities:  No swelling or varicosities noted  No results found for this or any previous visit (from the past 24 hours).   Assessment & Plan:  [name] was seen today for gynecologic exam.  Diagnoses and all orders for this visit:  Tetanus, diphtheria, and acellular pertussis (Tdap) vaccination declined  Human papilloma virus (HPV) vaccination declined  Cervical cancer screening -     Cytology - PAP  Screening for STD (sexually transmitted disease) -     Cervicovaginal ancillary only     Meds: No orders of the defined types were placed in this encounter.   Follow-up: Return in about 3 months (around 05/13/2023) for pcp .  This note has been created with Education officer, environmental. Any transcriptional errors are unintentional.   Marius Siemens, NP 02/13/2023, 3:05 PM

## 2023-02-13 NOTE — Addendum Note (Signed)
 Addended by: Kathryne Parisian on: 02/13/2023 03:31 PM   Modules accepted: Orders

## 2023-02-15 LAB — CERVICOVAGINAL ANCILLARY ONLY
Bacterial Vaginitis (gardnerella): NEGATIVE
Candida Glabrata: NEGATIVE
Candida Vaginitis: NEGATIVE
Chlamydia: NEGATIVE
Comment: NEGATIVE
Comment: NEGATIVE
Comment: NEGATIVE
Comment: NEGATIVE
Comment: NEGATIVE
Comment: NORMAL
Neisseria Gonorrhea: NEGATIVE
Trichomonas: NEGATIVE

## 2023-02-17 LAB — CYTOLOGY - PAP
Comment: NEGATIVE
Diagnosis: NEGATIVE
High risk HPV: NEGATIVE

## 2023-02-21 ENCOUNTER — Encounter (INDEPENDENT_AMBULATORY_CARE_PROVIDER_SITE_OTHER): Payer: Self-pay | Admitting: Primary Care

## 2023-03-26 ENCOUNTER — Encounter: Payer: Self-pay | Admitting: Nurse Practitioner

## 2023-03-27 ENCOUNTER — Other Ambulatory Visit: Payer: Self-pay | Admitting: Nurse Practitioner

## 2023-03-27 DIAGNOSIS — F32A Depression, unspecified: Secondary | ICD-10-CM

## 2023-03-27 MED ORDER — FLUOXETINE HCL 20 MG PO CAPS: 20.0000 mg | ORAL_CAPSULE | Freq: Every day | ORAL | 3 refills | Status: DC

## 2023-03-27 MED ORDER — FLUOXETINE HCL 40 MG PO CAPS: 40.0000 mg | ORAL_CAPSULE | Freq: Every day | ORAL | 1 refills | Status: DC

## 2023-09-03 IMAGING — CR DG CHEST 2V
2 series · 2 of 2 positions shown · non-contrast
Comparison: None.

CLINICAL DATA: Chest pain

EXAM:
CHEST - 2 VIEW

[w chest pa]
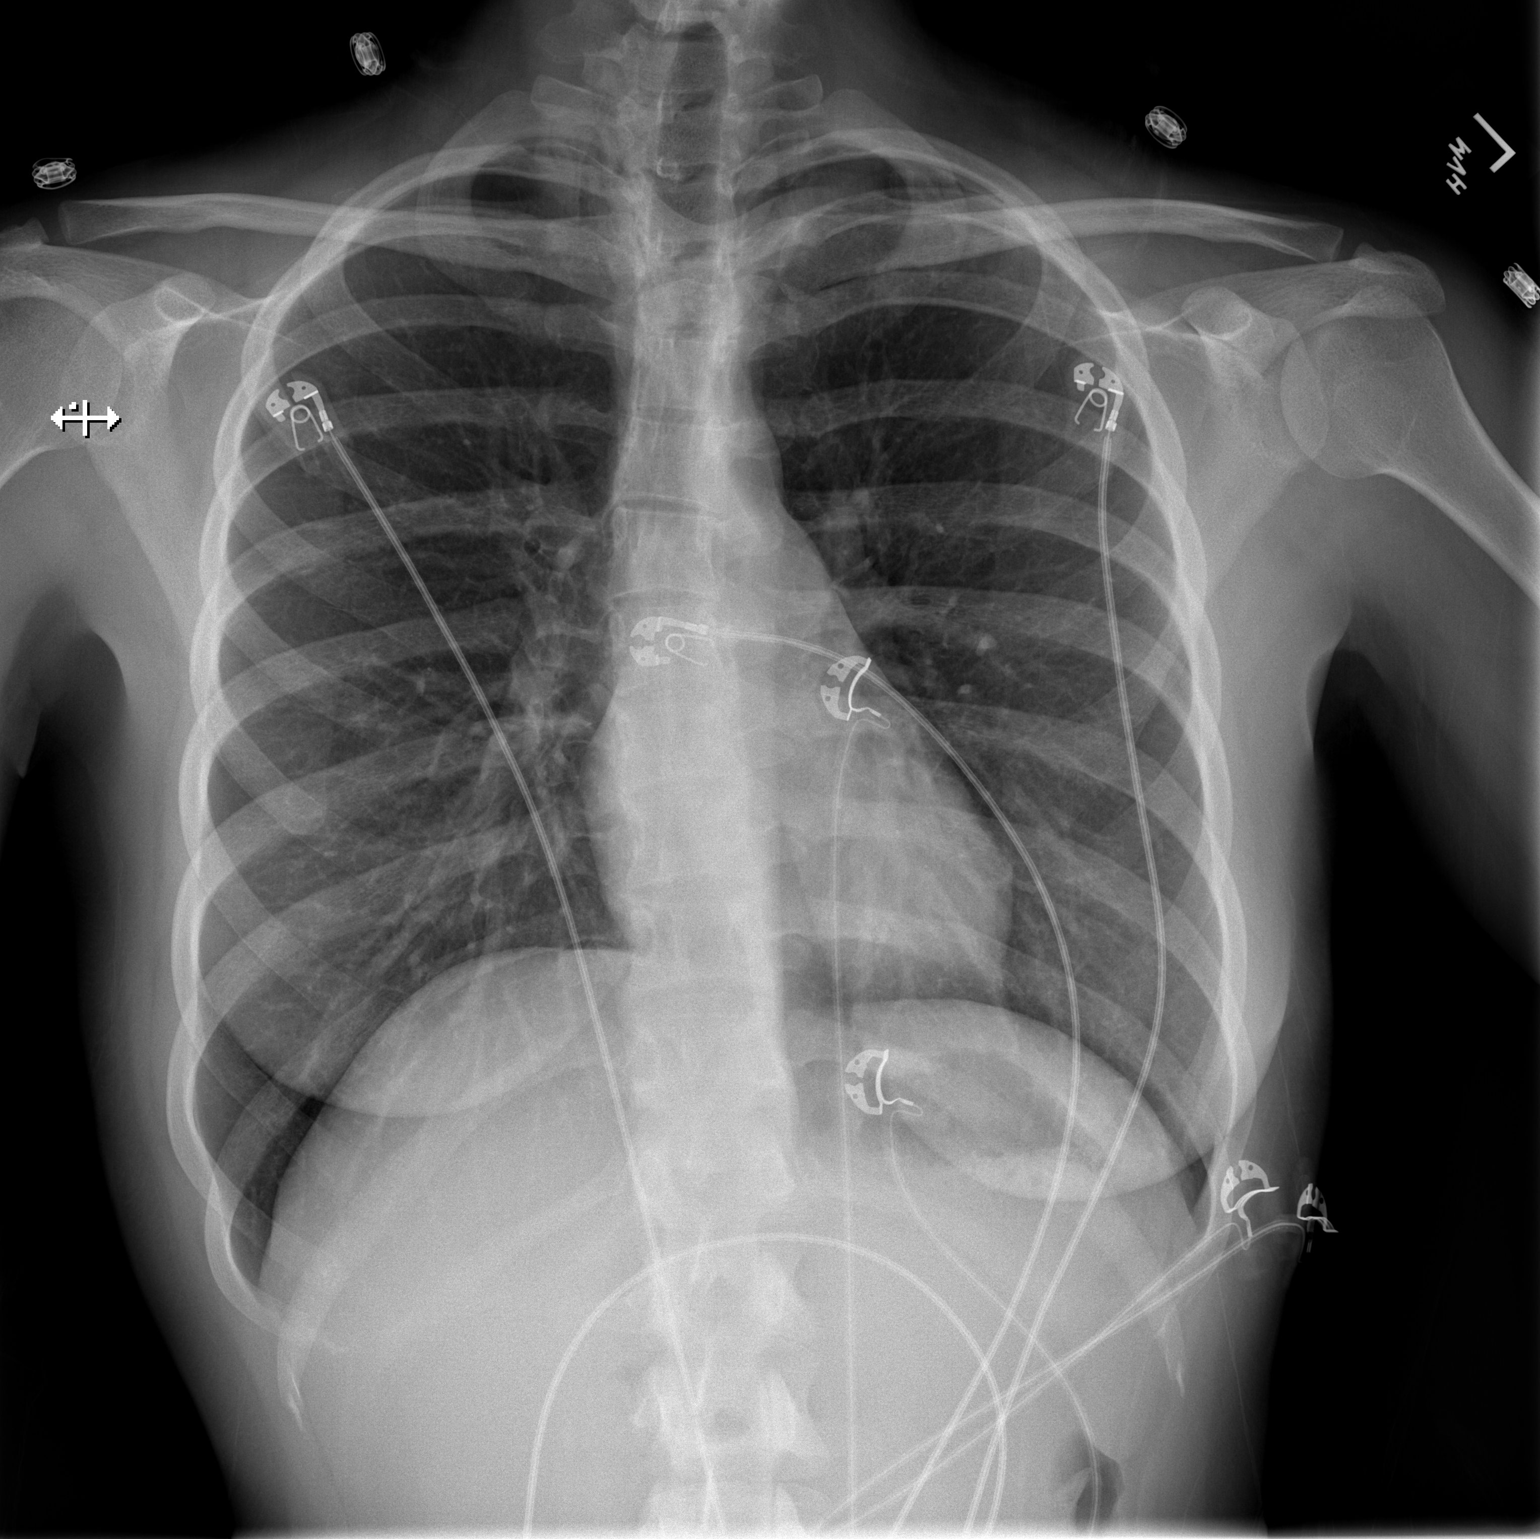

[w chest lat]
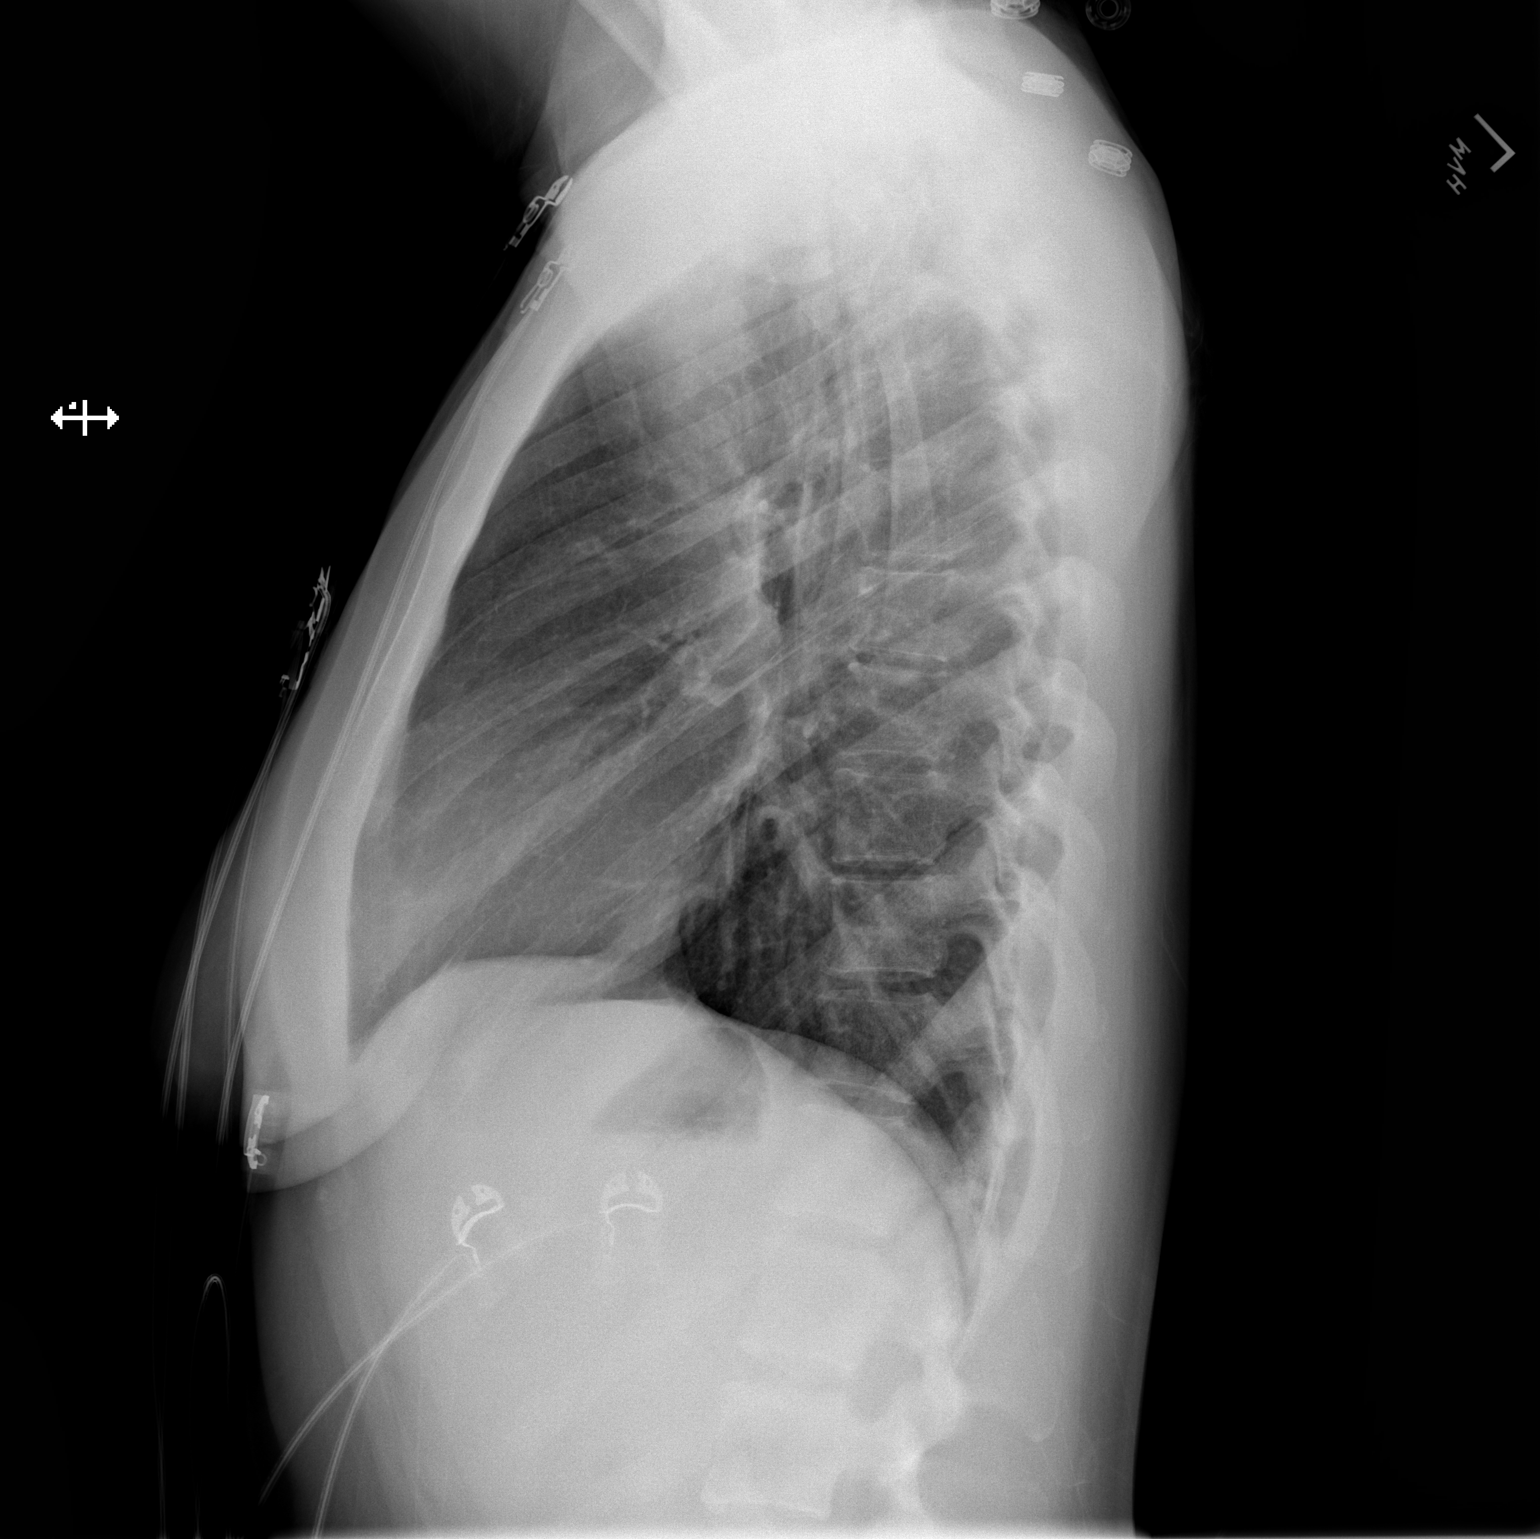

[2 of 2 positions shown; findings below may reference images not displayed]

FINDINGS: Lungs are clear.  No pleural effusion or pneumothorax.

The heart is normal in size.

Visualized osseous structures are within normal limits.
IMPRESSION: Normal chest radiographs.

## 2023-09-09 ENCOUNTER — Other Ambulatory Visit: Payer: Self-pay | Admitting: Nurse Practitioner

## 2023-09-09 DIAGNOSIS — F419 Anxiety disorder, unspecified: Secondary | ICD-10-CM

## 2023-09-11 ENCOUNTER — Other Ambulatory Visit: Payer: Self-pay | Admitting: Nurse Practitioner

## 2023-09-11 DIAGNOSIS — F419 Anxiety disorder, unspecified: Secondary | ICD-10-CM

## 2023-09-11 NOTE — Telephone Encounter (Unsigned)
 Copied from CRM [id number]. Topic: Clinical - Medication Refill >> Sep 11, 2023 12:53 PM Precious C wrote: Medication: FLUoxetine  (PROZAC ) 40 MG capsule  Has the patient contacted their pharmacy? Yes, pt was advised to call clinic (Agent: If no, request that the patient contact the pharmacy for the refill. If patient does not wish to contact the pharmacy document the reason why and proceed with request.) (Agent: If yes, when and what did the pharmacy advise?)  This is the patient's preferred pharmacy:  [id number] - [place], [place] - [address] AT [address] [place] [address] Phone: [phone] Fax: [phone]  Is this the correct pharmacy for this prescription? Yes If no, delete pharmacy and type the correct one.   Has the prescription been filled recently? No  Is the patient out of the medication? No  Has the patient been seen for an appointment in the last year OR does the patient have an upcoming appointment? No  Can we respond through MyChart? Yes  Agent: Please be advised that Rx refills may take up to 3 business days. We ask that you follow-up with your pharmacy.

## 2023-09-12 ENCOUNTER — Telehealth: Payer: Self-pay

## 2023-09-12 NOTE — Telephone Encounter (Signed)
 Requested medications are due for refill today.  yes  Requested medications are on the active medications list.  yes  Last refill. 03/27/2023 #90 1 rf  Future visit scheduled.   no  Notes to clinic.  Pt     Requested Prescriptions  Pending Prescriptions Disp Refills   FLUoxetine  (PROZAC ) 40 MG capsule 90 capsule 1    Sig: Take 1 capsule (40 mg total) by mouth daily.     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 Theotis Haze ORN, NP   1 year ago Anxiety and depression   [facility] [place]. [facility] Theotis Haze ORN, NP   2 years ago Anxiety and depression   [facility] [place]. [facility] Theotis Haze ORN, NP   2 years ago Anxiety and depression   [facility] [place]. [facility] Theotis Haze ORN, [place]

## 2023-09-12 NOTE — Telephone Encounter (Signed)
 Copied from CRM [id number]. Topic: Clinical - Medication Question >> Sep 12, 2023  2:51 PM Carlatta H wrote: Reason for CRM: Patient would like to know if she would be able to get medication refill until her appointment on 9/30//Please call to advise

## 2023-09-14 ENCOUNTER — Other Ambulatory Visit: Payer: Self-pay | Admitting: Nurse Practitioner

## 2023-09-14 DIAGNOSIS — F419 Anxiety disorder, unspecified: Secondary | ICD-10-CM

## 2023-09-14 DIAGNOSIS — Z30011 Encounter for initial prescription of contraceptive pills: Secondary | ICD-10-CM

## 2023-09-14 MED ORDER — FLUOXETINE HCL 40 MG PO CAPS: 40.0000 mg | ORAL_CAPSULE | Freq: Every day | ORAL | 0 refills | Status: DC

## 2023-09-14 MED ORDER — NORTREL 7/7/7 0.5/0.75/1-35 MG-MCG PO TABS: 1.0000 | ORAL_TABLET | Freq: Every day | ORAL | 0 refills | Status: DC

## 2023-09-14 NOTE — Telephone Encounter (Signed)
 Patient identified by name and date of birth.  Patient aware of refill.

## 2023-09-14 NOTE — Telephone Encounter (Signed)
 Refills sent

## 2023-10-03 ENCOUNTER — Ambulatory Visit: Attending: Nurse Practitioner | Admitting: Nurse Practitioner

## 2023-10-03 VITALS — BP 104/74 | HR 71 | Resp 19 | Ht 65.0 in | Wt 173.0 lb

## 2023-10-03 DIAGNOSIS — M25562 Pain in left knee: Secondary | ICD-10-CM

## 2023-10-03 DIAGNOSIS — F419 Anxiety disorder, unspecified: Secondary | ICD-10-CM | POA: Diagnosis not present

## 2023-10-03 DIAGNOSIS — F32A Depression, unspecified: Secondary | ICD-10-CM

## 2023-10-03 DIAGNOSIS — D72829 Elevated white blood cell count, unspecified: Secondary | ICD-10-CM

## 2023-10-03 DIAGNOSIS — D2239 Melanocytic nevi of other parts of face: Secondary | ICD-10-CM

## 2023-10-03 DIAGNOSIS — E876 Hypokalemia: Secondary | ICD-10-CM | POA: Diagnosis not present

## 2023-10-03 MED ORDER — FLUOXETINE HCL 40 MG PO CAPS: 40.0000 mg | ORAL_CAPSULE | Freq: Every day | ORAL | 3 refills | Status: AC

## 2023-10-03 NOTE — Patient Instructions (Signed)
 [facility]  [address] [place], [phone]

## 2023-10-03 NOTE — Progress Notes (Signed)
 Assessment & Plan:  Becky Day was seen today for medication refill.  Diagnoses and all orders for this visit:  Acute pain of left knee Copper knee sleeve Voltaren gel or biofreeze Work on losing weight to help reduce joint pain. May alternate with heat and ice application for pain relief. May also alternate with acetaminophen and Ibuprofen as prescribed pain relief. Other alternatives include massage, acupuncture and water aerobics.     Fibrous papule of nose -     Ambulatory referral to Dermatology  Anxiety and depression Increased mood stability -     FLUoxetine  (PROZAC ) 40 MG capsule; Take 1 capsule (40 mg total) by mouth daily.  Hypokalemia -     CMP14+EGFR  Low calcium levels -     VITAMIN D 25 Hydroxy (Vit-D Deficiency, Fractures)  Leukocytosis, unspecified type -     CBC with Differential    Patient has been counseled on age-appropriate routine health concerns for screening and prevention. These are reviewed and up-to-date. Referrals have been placed accordingly. Immunizations are up-to-date or declined.    Subjective:   Chief Complaint  Patient presents with   Medication Refill    History of Present Illness Becky Day is a 23 year old female who presents with left knee pain and  concerns of a bump on her nose.  She has been experiencing intermittent left knee pain for a few weeks, particularly noticeable when bending or lifting heavy objects. There is no associated swelling, popping, or giving out of the knee, and she denies any prior injury to the knee.  She also has a bump on her nose that has been present for several months. Initially, the bump would bleed and has since increased in size. It turns red upon touch and causes some pain. She is unsure what the bump is and wonders if it could be a mole.  Her past medical history includes anemia, which was previously identified during a CBC. She has experienced dizziness in the past, which she associates with her  anemia. She does not currently take multivitamins but has in the past due to dizziness.    Review of Systems  Constitutional:  Negative for fever, malaise/fatigue and weight loss.  HENT: Negative.  Negative for nosebleeds.   Respiratory: Negative.  Negative for cough and shortness of breath.   Cardiovascular: Negative.  Negative for chest pain, palpitations and leg swelling.  Musculoskeletal:  Positive for joint pain. Negative for myalgias.  Skin:        SEE HPI  Neurological: Negative.  Negative for dizziness, focal weakness, seizures and headaches.  Psychiatric/Behavioral: Negative.  Negative for suicidal ideas.     Past Medical History:  Diagnosis Date   Anxiety     Past Surgical History:  Procedure Laterality Date   NO PAST SURGERIES      Family History  Problem Relation Age of Onset   Bipolar disorder Mother    Bipolar disorder Sister    Bipolar disorder Sister    Bipolar disorder Sister     Social History Reviewed with no changes to be made today.   Outpatient Medications Prior to Visit  Medication Sig Dispense Refill   FLUoxetine  (PROZAC ) 40 MG capsule Take 1 capsule (40 mg total) by mouth daily. 90 capsule 0   cloNIDine HCl (KAPVAY) 0.1 MG TB12 ER tablet Take 0.1 mg by mouth at bedtime. (Patient not taking: Reported on 10/03/2023)     norethindrone-ethinyl estradiol (NORTREL  7/7/7) 0.5/0.75/1-35 MG-MCG tablet Take 1 tablet by  mouth daily. 90 tablet 0   No facility-administered medications prior to visit.    Allergies  Allergen Reactions   Amoxicillin  Rash       Objective:    BP 104/74 (BP Location: Left Arm, Patient Position: Sitting, Cuff Size: Normal)   Pulse 71   Resp 19   Ht 5' 5 (1.651 m)   Wt 173 lb (78.5 kg)   LMP 09/04/2023 (Approximate)   SpO2 100%   BMI 28.79 kg/m  Wt Readings from Last 3 Encounters:  10/03/23 173 lb (78.5 kg)  02/13/23 164 lb 6.4 oz (74.6 kg)  02/21/20 168 lb (76.2 kg)    Physical Exam Vitals and nursing note  reviewed.  Constitutional:      Appearance: She is well-developed.  HENT:     Head: Normocephalic and atraumatic.     Nose:     Comments: Fibrous papule Cardiovascular:     Rate and Rhythm: Normal rate and regular rhythm.     Heart sounds: Normal heart sounds. No murmur heard.    No friction rub. No gallop.  Pulmonary:     Effort: Pulmonary effort is normal. No tachypnea or respiratory distress.     Breath sounds: Normal breath sounds. No decreased breath sounds, wheezing, rhonchi or rales.  Chest:     Chest wall: No tenderness.  Musculoskeletal:        General: Normal range of motion.     Cervical back: Normal range of motion.  Skin:    General: Skin is warm and dry.  Neurological:     Mental Status: She is alert and oriented to person, place, and time.     Coordination: Coordination normal.  Psychiatric:        Behavior: Behavior normal. Behavior is cooperative.        Thought Content: Thought content normal.        Judgment: Judgment normal.          Patient has been counseled extensively about nutrition and exercise as well as the importance of adherence with medications and regular follow-up. The patient was given clear instructions to go to ER or return to medical center if symptoms don't improve, worsen or new problems develop. The patient verbalized understanding.   Follow-up: Return if symptoms worsen or fail to improve.   Haze LELON Servant, FNP-BC [facility] [place], [place] [phone]   10/28/2023, 1:01 PM

## 2023-10-04 ENCOUNTER — Ambulatory Visit: Payer: Self-pay | Admitting: Nurse Practitioner

## 2023-10-04 LAB — CBC WITH DIFFERENTIAL/PLATELET
Basophils Absolute: 0.1 x10E3/uL (ref 0.0–0.2)
Basos: 1 %
EOS (ABSOLUTE): 0.4 x10E3/uL (ref 0.0–0.4)
Eos: 4 %
Hematocrit: 41.1 % (ref 34.0–46.6)
Hemoglobin: 13 g/dL (ref 11.1–15.9)
Immature Grans (Abs): 0 x10E3/uL (ref 0.0–0.1)
Immature Granulocytes: 0 %
Lymphocytes Absolute: 2.2 x10E3/uL (ref 0.7–3.1)
Lymphs: 26 %
MCH: 27.9 pg (ref 26.6–33.0)
MCHC: 31.6 g/dL (ref 31.5–35.7)
MCV: 88 fL (ref 79–97)
Monocytes Absolute: 0.6 x10E3/uL (ref 0.1–0.9)
Monocytes: 7 %
Neutrophils Absolute: 5.2 x10E3/uL (ref 1.4–7.0)
Neutrophils: 62 %
Platelets: 333 x10E3/uL (ref 150–450)
RBC: 4.66 x10E6/uL (ref 3.77–5.28)
RDW: 12 % (ref 11.7–15.4)
WBC: 8.4 x10E3/uL (ref 3.4–10.8)

## 2023-10-04 LAB — CMP14+EGFR
ALT: 50 IU/L — ABNORMAL HIGH (ref 0–32)
AST: 31 IU/L (ref 0–40)
Albumin: 4.4 g/dL (ref 4.0–5.0)
Alkaline Phosphatase: 67 IU/L (ref 41–116)
BUN/Creatinine Ratio: 12 (ref 9–23)
BUN: 8 mg/dL (ref 6–20)
Bilirubin Total: 0.2 mg/dL (ref 0.0–1.2)
CO2: 21 mmol/L (ref 20–29)
Calcium: 9.3 mg/dL (ref 8.7–10.2)
Chloride: 105 mmol/L (ref 96–106)
Creatinine, Ser: 0.67 mg/dL (ref 0.57–1.00)
Globulin, Total: 2.3 g/dL (ref 1.5–4.5)
Glucose: 91 mg/dL (ref 70–99)
Potassium: 4.5 mmol/L (ref 3.5–5.2)
Sodium: 140 mmol/L (ref 134–144)
Total Protein: 6.7 g/dL (ref 6.0–8.5)
eGFR: 125 mL/min/1.73 (ref >59)

## 2023-10-04 LAB — VITAMIN D 25 HYDROXY (VIT D DEFICIENCY, FRACTURES): Vit D, 25-Hydroxy: 11 ng/mL — ABNORMAL LOW (ref 30.0–100.0)

## 2023-10-04 MED ORDER — VITAMIN D (ERGOCALCIFEROL) 1.25 MG (50000 UNIT) PO CAPS
50000.0000 [IU] | ORAL_CAPSULE | ORAL | 0 refills | Status: AC
Start: 2023-10-04 — End: ?

## 2023-10-17 ENCOUNTER — Encounter: Payer: Self-pay | Admitting: Nurse Practitioner

## 2023-10-28 ENCOUNTER — Encounter: Payer: Self-pay | Admitting: Nurse Practitioner
